# Patient Record
Sex: Female | Born: 1975 | Race: White | Hispanic: No | Marital: Married | Smoking: Current every day smoker
Health system: Southern US, Community
[De-identification: ages and names within clinical notes are randomized; demographics above are authoritative.]

## PROBLEM LIST (undated history)

## (undated) ENCOUNTER — Emergency Department (HOSPITAL_BASED_OUTPATIENT_CLINIC_OR_DEPARTMENT_OTHER): Discharge status: Absent without leave | Payer: Medicare Other

## (undated) DIAGNOSIS — F329 Major depressive disorder, single episode, unspecified: Secondary | ICD-10-CM

## (undated) DIAGNOSIS — F1910 Other psychoactive substance abuse, uncomplicated: Secondary | ICD-10-CM

## (undated) DIAGNOSIS — B019 Varicella without complication: Secondary | ICD-10-CM

## (undated) DIAGNOSIS — M797 Fibromyalgia: Secondary | ICD-10-CM

## (undated) DIAGNOSIS — K810 Acute cholecystitis: Secondary | ICD-10-CM

## (undated) DIAGNOSIS — R87612 Low grade squamous intraepithelial lesion on cytologic smear of cervix (LGSIL): Secondary | ICD-10-CM

## (undated) DIAGNOSIS — N893 Dysplasia of vagina, unspecified: Secondary | ICD-10-CM

## (undated) DIAGNOSIS — J189 Pneumonia, unspecified organism: Secondary | ICD-10-CM

## (undated) DIAGNOSIS — F32A Depression, unspecified: Secondary | ICD-10-CM

## (undated) DIAGNOSIS — Z8674 Personal history of sudden cardiac arrest: Secondary | ICD-10-CM

## (undated) DIAGNOSIS — J45909 Unspecified asthma, uncomplicated: Secondary | ICD-10-CM

## (undated) DIAGNOSIS — K219 Gastro-esophageal reflux disease without esophagitis: Secondary | ICD-10-CM

## (undated) DIAGNOSIS — K8000 Calculus of gallbladder with acute cholecystitis without obstruction: Secondary | ICD-10-CM

## (undated) DIAGNOSIS — F419 Anxiety disorder, unspecified: Secondary | ICD-10-CM

## (undated) HISTORY — DX: Anxiety disorder, unspecified: F41.9

## (undated) HISTORY — DX: Dysplasia of vagina, unspecified: N89.3

## (undated) HISTORY — DX: Varicella without complication: B01.9

## (undated) HISTORY — DX: Calculus of gallbladder with acute cholecystitis without obstruction: K80.00

## (undated) HISTORY — DX: Low grade squamous intraepithelial lesion on cytologic smear of cervix (LGSIL): R87.612

## (undated) HISTORY — DX: Fibromyalgia: M79.7

## (undated) HISTORY — PX: WRIST SURGERY: SHX841

## (undated) HISTORY — PX: NASAL SINUS SURGERY: SHX719

## (undated) HISTORY — DX: Acute cholecystitis: K81.0

---

## 2000-12-25 HISTORY — PX: HERNIA REPAIR: SHX51

## 2003-12-21 ENCOUNTER — Other Ambulatory Visit: Payer: Self-pay

## 2003-12-26 HISTORY — PX: BREAST EXCISIONAL BIOPSY: SUR124

## 2005-03-20 ENCOUNTER — Emergency Department: Payer: Self-pay | Admitting: General Practice

## 2005-06-23 ENCOUNTER — Other Ambulatory Visit: Payer: Self-pay

## 2005-06-23 ENCOUNTER — Inpatient Hospital Stay: Payer: Self-pay | Admitting: Psychiatry

## 2005-12-27 ENCOUNTER — Inpatient Hospital Stay: Payer: Self-pay | Admitting: Psychiatry

## 2006-02-03 ENCOUNTER — Ambulatory Visit: Payer: Self-pay | Admitting: Psychiatry

## 2006-04-07 ENCOUNTER — Emergency Department: Payer: Self-pay | Admitting: Emergency Medicine

## 2006-04-12 ENCOUNTER — Ambulatory Visit: Payer: Self-pay | Admitting: Otolaryngology

## 2006-05-11 ENCOUNTER — Other Ambulatory Visit: Payer: Self-pay

## 2006-05-11 ENCOUNTER — Emergency Department: Payer: Self-pay | Admitting: Unknown Physician Specialty

## 2006-05-30 ENCOUNTER — Emergency Department: Payer: Self-pay | Admitting: Emergency Medicine

## 2006-07-13 ENCOUNTER — Other Ambulatory Visit: Payer: Self-pay

## 2006-07-13 ENCOUNTER — Observation Stay: Payer: Self-pay | Admitting: Internal Medicine

## 2006-07-14 ENCOUNTER — Inpatient Hospital Stay: Payer: Self-pay | Admitting: Psychiatry

## 2007-03-06 ENCOUNTER — Emergency Department: Payer: Self-pay | Admitting: Emergency Medicine

## 2007-03-27 ENCOUNTER — Emergency Department: Payer: Self-pay | Admitting: Emergency Medicine

## 2007-04-27 ENCOUNTER — Emergency Department: Payer: Self-pay | Admitting: General Practice

## 2008-04-14 ENCOUNTER — Emergency Department: Payer: Self-pay | Admitting: Emergency Medicine

## 2009-01-11 ENCOUNTER — Emergency Department: Payer: Self-pay

## 2009-04-26 ENCOUNTER — Emergency Department: Payer: Self-pay | Admitting: Emergency Medicine

## 2009-07-05 ENCOUNTER — Ambulatory Visit: Payer: Self-pay | Admitting: Orthopedic Surgery

## 2009-07-06 ENCOUNTER — Ambulatory Visit: Payer: Self-pay | Admitting: Orthopedic Surgery

## 2009-08-13 ENCOUNTER — Encounter: Payer: Self-pay | Admitting: Orthopedic Surgery

## 2009-08-25 ENCOUNTER — Encounter: Payer: Self-pay | Admitting: Orthopedic Surgery

## 2009-09-03 ENCOUNTER — Ambulatory Visit: Payer: Self-pay

## 2011-06-07 ENCOUNTER — Ambulatory Visit: Payer: Self-pay

## 2011-12-26 HISTORY — PX: ABDOMINAL HYSTERECTOMY: SHX81

## 2012-04-22 ENCOUNTER — Ambulatory Visit: Payer: Self-pay | Admitting: General Practice

## 2012-04-24 ENCOUNTER — Ambulatory Visit: Payer: Self-pay | Admitting: General Practice

## 2012-06-27 ENCOUNTER — Emergency Department: Payer: Self-pay | Admitting: Emergency Medicine

## 2012-06-27 LAB — COMPREHENSIVE METABOLIC PANEL
Anion Gap: 8 (ref 7–16)
BUN: 11 mg/dL (ref 7–18)
Bilirubin,Total: 0.6 mg/dL (ref 0.2–1.0)
Chloride: 111 mmol/L — ABNORMAL HIGH (ref 98–107)
Creatinine: 0.9 mg/dL (ref 0.60–1.30)
EGFR (African American): >60
EGFR (Non-African Amer.): >60
Potassium: 3.5 mmol/L (ref 3.5–5.1)
SGPT (ALT): 30 U/L
Total Protein: 6.9 g/dL (ref 6.4–8.2)

## 2012-06-27 LAB — ETHANOL: Ethanol %: <0.003 % (ref 0.000–0.080)

## 2012-06-27 LAB — URINALYSIS, COMPLETE
Glucose,UR: NEGATIVE mg/dL (ref 0–75)
Nitrite: NEGATIVE
RBC,UR: 23 /HPF (ref 0–5)
WBC UR: 72 /HPF (ref 0–5)

## 2012-06-27 LAB — DRUG SCREEN, URINE
Barbiturates, Ur Screen: NEGATIVE (ref ?–200)
Cannabinoid 50 Ng, Ur ~~LOC~~: NEGATIVE (ref ?–50)
Cocaine Metabolite,Ur ~~LOC~~: POSITIVE (ref ?–300)
MDMA (Ecstasy)Ur Screen: POSITIVE (ref ?–500)
Methadone, Ur Screen: NEGATIVE (ref ?–300)
Phencyclidine (PCP) Ur S: NEGATIVE (ref ?–25)

## 2012-06-27 LAB — CBC
HCT: 41.5 % (ref 35.0–47.0)
HGB: 13.8 g/dL (ref 12.0–16.0)
MCHC: 33.2 g/dL (ref 32.0–36.0)
MCV: 95 fL (ref 80–100)
Platelet: 241 10*3/uL (ref 150–440)
RBC: 4.38 10*6/uL (ref 3.80–5.20)

## 2012-06-29 LAB — URINE CULTURE

## 2013-03-29 ENCOUNTER — Emergency Department: Payer: Self-pay | Admitting: Emergency Medicine

## 2013-03-29 LAB — BASIC METABOLIC PANEL
Anion Gap: 2 — ABNORMAL LOW (ref 7–16)
BUN: 10 mg/dL (ref 7–18)
Co2: 27 mmol/L (ref 21–32)
Creatinine: 0.75 mg/dL (ref 0.60–1.30)
EGFR (African American): >60
EGFR (Non-African Amer.): >60
Glucose: 85 mg/dL (ref 65–99)
Osmolality: 274 (ref 275–301)
Potassium: 4.2 mmol/L (ref 3.5–5.1)
Sodium: 138 mmol/L (ref 136–145)

## 2013-03-29 LAB — CBC
MCH: 30.9 pg (ref 26.0–34.0)
MCHC: 33.9 g/dL (ref 32.0–36.0)
MCV: 91 fL (ref 80–100)
Platelet: 223 10*3/uL (ref 150–440)
RBC: 4.67 10*6/uL (ref 3.80–5.20)
WBC: 4.2 10*3/uL (ref 3.6–11.0)

## 2013-03-29 LAB — CK TOTAL AND CKMB (NOT AT ARMC): CK, Total: 46 U/L (ref 21–215)

## 2013-05-15 ENCOUNTER — Ambulatory Visit: Payer: Self-pay | Admitting: Nurse Practitioner

## 2013-09-11 ENCOUNTER — Ambulatory Visit: Payer: Self-pay | Admitting: Internal Medicine

## 2014-03-04 ENCOUNTER — Emergency Department: Payer: Self-pay | Admitting: Emergency Medicine

## 2014-03-04 LAB — CBC
HCT: 43.2 % (ref 35.0–47.0)
HGB: 15 g/dL (ref 12.0–16.0)
MCH: 31.6 pg (ref 26.0–34.0)
MCHC: 34.6 g/dL (ref 32.0–36.0)
MCV: 91 fL (ref 80–100)
PLATELETS: 236 10*3/uL (ref 150–440)
RBC: 4.74 10*6/uL (ref 3.80–5.20)
RDW: 12.7 % (ref 11.5–14.5)
WBC: 4.3 10*3/uL (ref 3.6–11.0)

## 2014-03-04 LAB — BASIC METABOLIC PANEL
ANION GAP: 3 — AB (ref 7–16)
BUN: 7 mg/dL (ref 7–18)
Calcium, Total: 8.7 mg/dL (ref 8.5–10.1)
Chloride: 109 mmol/L — ABNORMAL HIGH (ref 98–107)
Co2: 27 mmol/L (ref 21–32)
Creatinine: 0.8 mg/dL (ref 0.60–1.30)
EGFR (African American): >60
EGFR (Non-African Amer.): >60
GLUCOSE: 78 mg/dL (ref 65–99)
OSMOLALITY: 274 (ref 275–301)
Potassium: 4.2 mmol/L (ref 3.5–5.1)
SODIUM: 139 mmol/L (ref 136–145)

## 2014-03-04 LAB — TROPONIN I

## 2014-06-12 ENCOUNTER — Ambulatory Visit: Payer: Self-pay | Admitting: Nurse Practitioner

## 2015-04-08 ENCOUNTER — Ambulatory Visit
Admit: 2015-04-08 | Disposition: A | Discharge status: Home / Self Care | Payer: Self-pay | Attending: Nurse Practitioner | Admitting: Nurse Practitioner

## 2015-06-30 ENCOUNTER — Ambulatory Visit: Payer: Self-pay | Admitting: Obstetrics and Gynecology

## 2015-07-08 ENCOUNTER — Ambulatory Visit (INDEPENDENT_AMBULATORY_CARE_PROVIDER_SITE_OTHER): Payer: Medicaid Other | Admitting: Obstetrics and Gynecology

## 2015-07-08 ENCOUNTER — Encounter: Payer: Self-pay | Admitting: Obstetrics and Gynecology

## 2015-07-08 VITALS — BP 124/76 | HR 99 | Ht 63.0 in | Wt 164.8 lb

## 2015-07-08 DIAGNOSIS — J310 Chronic rhinitis: Secondary | ICD-10-CM | POA: Insufficient documentation

## 2015-07-08 DIAGNOSIS — B019 Varicella without complication: Secondary | ICD-10-CM | POA: Insufficient documentation

## 2015-07-08 DIAGNOSIS — E559 Vitamin D deficiency, unspecified: Secondary | ICD-10-CM | POA: Insufficient documentation

## 2015-07-08 DIAGNOSIS — F32A Depression, unspecified: Secondary | ICD-10-CM | POA: Insufficient documentation

## 2015-07-08 DIAGNOSIS — N943 Premenstrual tension syndrome: Secondary | ICD-10-CM | POA: Diagnosis not present

## 2015-07-08 DIAGNOSIS — F819 Developmental disorder of scholastic skills, unspecified: Secondary | ICD-10-CM | POA: Insufficient documentation

## 2015-07-08 DIAGNOSIS — Z720 Tobacco use: Secondary | ICD-10-CM

## 2015-07-08 DIAGNOSIS — J45909 Unspecified asthma, uncomplicated: Secondary | ICD-10-CM | POA: Insufficient documentation

## 2015-07-08 DIAGNOSIS — F329 Major depressive disorder, single episode, unspecified: Secondary | ICD-10-CM | POA: Insufficient documentation

## 2015-07-08 HISTORY — DX: Varicella without complication: B01.9

## 2015-07-08 MED ORDER — FLUOXETINE HCL 20 MG PO CAPS: 20.0000 mg | ORAL_CAPSULE | Freq: Every day | ORAL | Status: DC

## 2015-07-08 MED ORDER — LEVONORGEST-ETH ESTRAD 91-DAY 0.15-0.03 MG PO TABS: 1.0000 | ORAL_TABLET | Freq: Every day | ORAL | Status: DC

## 2015-07-09 ENCOUNTER — Encounter: Payer: Self-pay | Admitting: Obstetrics and Gynecology

## 2015-07-09 DIAGNOSIS — N943 Premenstrual tension syndrome: Secondary | ICD-10-CM | POA: Insufficient documentation

## 2015-07-09 DIAGNOSIS — Z720 Tobacco use: Secondary | ICD-10-CM | POA: Insufficient documentation

## 2015-07-09 MED ORDER — NORETHINDRONE 0.35 MG PO TABS: 1.0000 | ORAL_TABLET | Freq: Every day | ORAL | Status: DC

## 2015-07-09 NOTE — Progress Notes (Signed)
GYNECOLOGY PROGRESS NOTE  Subjective:    Patient ID: Shannon Campbell, female    DOB: December 23, 1976, 39 y.o.   MRN: [id number]  HPI  Patient is a 39 y.o. [id number] female who presents as a referral from [facility] for discussion of "hormonal imbalance".  Patient notes that she has had symptoms of severe PMS since onset of menses. Menarche age 89.  Diagnosed with precocious puberty at age 89.   PMS symtoms include severe mood changes, acne, weight gain with bloating, dysmenorrhea with all occuring just prior to menses. Also with heavy menstrual cycles resulting in seeking definitive therapy with hysterectomy several years ago.  Has been tried on cyclic OCPs in the past (in teenage years) and Mirena IUD prior to hysterectomy.  Also has participated in psychotherapy over the past 17 years, and has been tried on antidepressants before (was on Wellbutrin x 6 years, however notes only modest improvement in symptoms, and now reports no improvement at all over the past year so is weaning off; has also tried Prozac in the past  years, using 1st 2 weeks prior to menses and noted improvement, however was not consistent in taking the medication at the time.   The following portions of the patient's history were reviewed and updated as appropriate: allergies, current medications, past family history, past medical history, past social history, past surgical history and problem list.  Review of Systems Pertinent items are noted in HPI.   Objective:   Blood pressure 124/76, pulse 99, height 5\' 3"  (1.6 m), weight 164 lb 12.8 oz (74.753 kg), last menstrual period 12/26/2011. General appearance: alert and no distress Exam deferred.   Assessment:   Moderate-severe PMS symptoms  Plan:   Discussion had with patient regarding symptom management.  Discussed use of other hormonal agents, including continuous OCPs (progesterone only as patient is a smoker), Nexplanon, Depo Provera, as well ans another trial of  Prozac to take 2 weeks prior to onset of symptoms.  Patient to begin Prozac once weaned off Wellbutrin if symptoms persist despite use of continuous OCPs.  Encouraged smoking cessation. RTC in 2 months to reassess symptoms.   Hildred LaserAnika Rosibel Giacobbe, MD [facility]

## 2015-07-09 NOTE — Patient Instructions (Signed)
Take Prozac 2 weeks prior to onset of symptoms.  Keep calendar to track monthly onset of symptoms.  Take OCPs daily.  Smoking cessation.

## 2015-09-09 ENCOUNTER — Ambulatory Visit: Payer: Medicaid Other | Admitting: Obstetrics and Gynecology

## 2015-11-01 DIAGNOSIS — M2550 Pain in unspecified joint: Secondary | ICD-10-CM

## 2015-11-01 DIAGNOSIS — R768 Other specified abnormal immunological findings in serum: Secondary | ICD-10-CM | POA: Insufficient documentation

## 2015-11-01 DIAGNOSIS — G8929 Other chronic pain: Secondary | ICD-10-CM | POA: Insufficient documentation

## 2015-11-01 DIAGNOSIS — R5382 Chronic fatigue, unspecified: Secondary | ICD-10-CM | POA: Insufficient documentation

## 2016-05-16 ENCOUNTER — Encounter: Payer: Self-pay | Admitting: Obstetrics and Gynecology

## 2016-05-23 ENCOUNTER — Encounter: Payer: Self-pay | Admitting: Obstetrics and Gynecology

## 2016-05-29 ENCOUNTER — Other Ambulatory Visit: Payer: Self-pay | Admitting: Family

## 2016-08-06 ENCOUNTER — Observation Stay
Admission: EM | Admit: 2016-08-06 | Discharge: 2016-08-08 | Disposition: A | Discharge status: Home / Self Care | Payer: Medicare Other | Attending: Internal Medicine | Admitting: Internal Medicine

## 2016-08-06 ENCOUNTER — Encounter: Payer: Self-pay | Admitting: Emergency Medicine

## 2016-08-06 ENCOUNTER — Emergency Department: Payer: Medicare Other

## 2016-08-06 DIAGNOSIS — Z90710 Acquired absence of both cervix and uterus: Secondary | ICD-10-CM | POA: Insufficient documentation

## 2016-08-06 DIAGNOSIS — F419 Anxiety disorder, unspecified: Secondary | ICD-10-CM | POA: Diagnosis not present

## 2016-08-06 DIAGNOSIS — Z8674 Personal history of sudden cardiac arrest: Secondary | ICD-10-CM | POA: Diagnosis not present

## 2016-08-06 DIAGNOSIS — Z79891 Long term (current) use of opiate analgesic: Secondary | ICD-10-CM | POA: Insufficient documentation

## 2016-08-06 DIAGNOSIS — M359 Systemic involvement of connective tissue, unspecified: Secondary | ICD-10-CM | POA: Diagnosis not present

## 2016-08-06 DIAGNOSIS — Z79899 Other long term (current) drug therapy: Secondary | ICD-10-CM | POA: Insufficient documentation

## 2016-08-06 DIAGNOSIS — J45909 Unspecified asthma, uncomplicated: Secondary | ICD-10-CM | POA: Diagnosis not present

## 2016-08-06 DIAGNOSIS — F17210 Nicotine dependence, cigarettes, uncomplicated: Secondary | ICD-10-CM | POA: Insufficient documentation

## 2016-08-06 DIAGNOSIS — F329 Major depressive disorder, single episode, unspecified: Secondary | ICD-10-CM | POA: Insufficient documentation

## 2016-08-06 DIAGNOSIS — Z793 Long term (current) use of hormonal contraceptives: Secondary | ICD-10-CM | POA: Diagnosis not present

## 2016-08-06 DIAGNOSIS — R079 Chest pain, unspecified: Secondary | ICD-10-CM | POA: Diagnosis not present

## 2016-08-06 DIAGNOSIS — Z9889 Other specified postprocedural states: Secondary | ICD-10-CM | POA: Insufficient documentation

## 2016-08-06 DIAGNOSIS — J189 Pneumonia, unspecified organism: Secondary | ICD-10-CM | POA: Insufficient documentation

## 2016-08-06 HISTORY — DX: Major depressive disorder, single episode, unspecified: F32.9

## 2016-08-06 HISTORY — DX: Unspecified asthma, uncomplicated: J45.909

## 2016-08-06 HISTORY — DX: Depression, unspecified: F32.A

## 2016-08-06 HISTORY — DX: Personal history of sudden cardiac arrest: Z86.74

## 2016-08-06 LAB — CBC
HCT: 40.1 % (ref 35.0–47.0)
HEMOGLOBIN: 14.2 g/dL (ref 12.0–16.0)
MCH: 31.9 pg (ref 26.0–34.0)
MCHC: 35.4 g/dL (ref 32.0–36.0)
MCV: 90.2 fL (ref 80.0–100.0)
PLATELETS: 262 10*3/uL (ref 150–440)
RBC: 4.45 MIL/uL (ref 3.80–5.20)
RDW: 12.7 % (ref 11.5–14.5)
WBC: 8.8 10*3/uL (ref 3.6–11.0)

## 2016-08-06 LAB — BASIC METABOLIC PANEL
ANION GAP: 5 (ref 5–15)
BUN: 10 mg/dL (ref 6–20)
CALCIUM: 8.7 mg/dL — AB (ref 8.9–10.3)
CO2: 27 mmol/L (ref 22–32)
CREATININE: 0.87 mg/dL (ref 0.44–1.00)
Chloride: 106 mmol/L (ref 101–111)
Glucose, Bld: 71 mg/dL (ref 65–99)
Potassium: 3.6 mmol/L (ref 3.5–5.1)
SODIUM: 138 mmol/L (ref 135–145)

## 2016-08-06 LAB — TROPONIN I

## 2016-08-06 MED ORDER — ASPIRIN 81 MG PO CHEW
324.0000 mg | CHEWABLE_TABLET | Freq: Once | ORAL | Status: AC
  Administered 2016-08-06: 324 mg via ORAL
  Filled 2016-08-06: qty 4

## 2016-08-06 MED ORDER — NITROGLYCERIN 0.4 MG SL SUBL
0.4000 mg | SUBLINGUAL_TABLET | SUBLINGUAL | Status: DC | PRN
  Administered 2016-08-06 – 2016-08-07 (×2): 0.4 mg via SUBLINGUAL
  Filled 2016-08-06 (×2): qty 1

## 2016-08-06 NOTE — ED Provider Notes (Signed)
[facility] Emergency Department Provider Note  ____________________________________________   I have reviewed the triage vital signs and the nursing notes.   HISTORY  Chief Complaint Chest Pain   HPI Shannon Campbell is a 40 y.o. 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 Patient states the pain was initially 10 out of 10 located to the left chest radiating to her mid upper back. Patient also admits to dyspnea the time of pain. Patient denies any diaphoresis. Patient current pain score is 8 out of 10. Patient denies any other medical history. Patient denies any lower extremity pain or swelling   Past Medical History:  Diagnosis Date  . Anxiety   . Asthma   . Depression   . History of cardiac arrest   . Pap smear abnormality of cervix with LGSIL   . VAIN (vaginal intraepithelial neoplasia)     Patient Active Problem List   Diagnosis Date Noted  . PMS (premenstrual syndrome) 07/09/2015  . Tobacco abuse 07/09/2015  . Asthma without status asthmaticus 07/08/2015  . Chicken pox 07/08/2015  . Clinical depression 07/08/2015  . 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 Inflamed nasal mucosa 07/08/2015  . Avitaminosis D 07/08/2015    Past Surgical History:  Procedure Laterality Date  . ABDOMINAL HYSTERECTOMY  2013  . CESAREAN SECTION  1999  . HERNIA REPAIR  2002  . NASAL SINUS SURGERY    . WRIST SURGERY      Prior to Admission medications   Medication Sig Start Date End Date Taking? Authorizing Provider  buPROPion (WELLBUTRIN XL) 150 MG 24 hr tablet Take 150 mg by mouth daily.   Yes Historical Provider, MD  clonazePAM (KLONOPIN) 0.5 MG tablet Take 0.5 mg by mouth at bedtime.   Yes Historical Provider, MD  FLUoxetine (PROZAC) 20 MG capsule Take 1 capsule (20 mg total) by mouth daily. Begin with 1/2 tablet daily for 2 weeks, if  symptoms unrelieved, can increase to 1 whole tablet.  To take for 2 weeks prior to menses each month. Patient taking differently: Take 20 mg by mouth daily.  07/08/15  Yes Hildred Laser, MD  HYDROcodone-acetaminophen (NORCO/VICODIN) 5-325 MG tablet Take 1 tablet by mouth every 6 (six) hours as needed for moderate pain.    Yes Historical Provider, MD  methylphenidate (METADATE CD) 20 MG CR capsule Take 20 mg by mouth 2 (two) times daily.   Yes Historical Provider, MD  omega-3 acid ethyl esters (LOVAZA) 1 g capsule Take by mouth 2 (two) times daily.   Yes Historical Provider, MD  Omega-3 Fatty Acids (FISH OIL) 1000 MG CAPS Take 1,000 mg by mouth daily.   Yes Historical Provider, MD  norethindrone (MICRONOR,CAMILA,ERRIN) 0.35 MG tablet Take 1 tablet (0.35 mg total) by mouth daily. Patient not taking: Reported on 08/07/2016 07/09/15   Hildred Laser, MD    Allergies Prednisone  Family History  Problem Relation Age of Onset  . Thyroid disease Mother   . Hypertension Father   . Hemochromatosis Father     Social History Social History  Substance Use Topics  . Smoking status: Current Every Day Smoker    Packs/day: 1.00    Years: 15.00    Types: Cigarettes  . Smokeless tobacco: Never Used  . Alcohol use No    Review of Systems Constitutional: No fever/chills Eyes: No visual changes. ENT: No sore throat. Cardiovascular: Positive for chest pain.  Respiratory: Positive for shortness of breath. Gastrointestinal: No abdominal pain.  No nausea, no vomiting.  No diarrhea.  No constipation. Genitourinary: Negative for dysuria. Musculoskeletal: Negative for back pain. Skin: Negative for rash. Neurological: Negative for headaches, focal weakness or numbness.  10-point ROS otherwise negative.  ____________________________________________   PHYSICAL EXAM:  VITAL SIGNS: ED Triage Vitals [08/06/16 2301]  Enc Vitals Group     BP 116/73     Pulse Rate 70     Resp 20     Temp 97.8 F (36.6 C)      Temp Source Oral     SpO2 99 %     Weight 168 lb (76.2 kg)     Height 5\' 3"  (1.6 m)     Head Circumference      Peak Flow      Pain Score 9     Pain Loc      Pain Edu?      Excl. in GC?     Constitutional: Alert and oriented. Well appearing and in no acute distress. Eyes: Conjunctivae are normal. PERRL. EOMI. Head: Atraumatic. Mouth/Throat: Mucous membranes are moist.  Oropharynx non-erythematous. Neck: No stridor.  No meningeal signs.   Cardiovascular: Normal rate, regular rhythm. Good peripheral circulation. Grossly normal heart sounds.   Respiratory: Normal respiratory effort.  No retractions. Lungs CTAB. Gastrointestinal: Soft and nontender. No distention.  Musculoskeletal: No lower extremity tenderness nor edema. No gross deformities of extremities. Neurologic:  Normal speech and language. No gross focal neurologic deficits are appreciated.  Skin:  Skin is warm, dry and intact. No rash noted. Psychiatric: Mood and affect are normal. Speech and behavior are normal.  ____________________________________________   LABS (all labs ordered are listed, but only abnormal results are displayed)  Labs Reviewed  BASIC METABOLIC PANEL - Abnormal; Notable for the following:       Result Value   Calcium 8.7 (*)    All other components within normal limits  FIBRIN DERIVATIVES D-DIMER ([facility] ONLY) - Abnormal; Notable for the following:    Fibrin derivatives D-dimer ([facility]) 500 (*)    All other components within normal limits  CBC  TROPONIN I  TROPONIN I  URINALYSIS COMPLETEWITH MICROSCOPIC ([facility] ONLY)  URINE DRUG SCREEN, QUALITATIVE ([facility] ONLY)   ____________________________________________  EKG  ED ECG REPORT I, [place] N BROWN, the attending physician, personally viewed and interpreted this ECG.   Date: 08/06/2016  EKG Time: 10:59 PM  Rate: 83  Rhythm: Sinus rhythm  Axis: Normal  Intervals: Normal  ST&T Change:  None  ____________________________________________  RADIOLOGY I, [place] N BROWN, personally viewed and evaluated these images (plain radiographs) as part of my medical decision making, as well as reviewing the written report by the radiologist.  Dg Chest 2 View  Result Date: 08/06/2016 CLINICAL DATA:  Sudden onset sharp chest pain radiating to the back. Left arm pain. Onset 1 hour ago. Shortness of breath and nausea. EXAM: CHEST  2 VIEW COMPARISON:  03/04/2014 FINDINGS: The heart size and mediastinal contours are within normal limits. Both lungs are clear. The visualized skeletal structures are unremarkable. IMPRESSION: No active cardiopulmonary disease. Electronically Signed   By: Burman NievesWilliam  Stevens M.D.   On: 08/06/2016 23:50   Ct Angio Chest Pe W And/or Wo Contrast  Result Date: 08/07/2016 CLINICAL DATA:  Acute onset of sharp mid chest pain, radiating to the back, with left arm pain. Shortness of breath and nausea. Initial encounter. EXAM: CT ANGIOGRAPHY CHEST WITH CONTRAST TECHNIQUE: Multidetector CT imaging  of the chest was performed using the standard protocol during bolus administration of intravenous contrast. Multiplanar CT image reconstructions and MIPs were obtained to evaluate the vascular anatomy. CONTRAST:  75 mL of Isovue 370 IV contrast COMPARISON:  Chest radiograph performed 08/06/2016 FINDINGS: There is no evidence of pulmonary embolus. Minimal bibasilar atelectasis is noted. Minimal scattered peripheral opacities are seen bilaterally, raising concern for an acute infectious process. There is no evidence of pleural effusion or pneumothorax. No masses are identified; no abnormal focal contrast enhancement is seen. The mediastinum is unremarkable in appearance. No mediastinal lymphadenopathy is seen. No pericardial effusion is identified. The great vessels are grossly unremarkable in appearance. No axillary lymphadenopathy is seen. The visualized portions of the thyroid gland are  unremarkable in appearance. The visualized portions of the liver and spleen are unremarkable. No acute osseous abnormalities are seen. Review of the MIP images confirms the above findings. IMPRESSION: 1. No evidence of pulmonary embolus. 2. Minimal scattered peripheral opacities seen bilaterally, concerning for an acute infectious process. 3. Minimal bibasilar atelectasis noted. Electronically Signed   By: Roanna Raider M.D.   On: 08/07/2016 02:40      Procedures     INITIAL IMPRESSION / ASSESSMENT AND PLAN / ED COURSE  Pertinent labs & imaging results that were available during my care of the patient were reviewed by me and considered in my medical decision making (see chart for details).  EKG revealed no evidence of ischemia or infarct. CT scan of the chest revealed as stated above (sees no evidence of pulmonary emboli) Patient given aspirin 324 mg sublingual nitroglycerin without any improvement of pain. Some subsequent patient was given morphine 4 mg with no improvement of pain. At this time patient admits to ongoing chest pain and a such patient was discussed with Dr. Sheryle Hail for hospital admission for further evaluation and management.  Clinical Course    ____________________________________________  FINAL CLINICAL IMPRESSION(S) / ED DIAGNOSES  Final diagnoses:  Chest pain, unspecified chest pain type     MEDICATIONS GIVEN DURING THIS VISIT:  Medications  nitroGLYCERIN (NITROSTAT) SL tablet 0.4 mg (0.4 mg Sublingual Given 08/06/16 2339)  HYDROmorphone (DILAUDID) injection 1 mg (not administered)  aspirin chewable tablet 324 mg (324 mg Oral Given 08/06/16 2339)  morphine 4 MG/ML injection 4 mg (4 mg Intravenous Given 08/07/16 0040)  iopamidol (ISOVUE-370) 76 % injection 75 mL (75 mLs Intravenous Contrast Given 08/07/16 0211)     NEW OUTPATIENT MEDICATIONS STARTED DURING THIS VISIT:  New Prescriptions   No medications on file      Note:  This document was prepared  using Dragon voice recognition software and may include unintentional dictation errors.    Darci Current, MD 08/07/16 [phone]

## 2016-08-06 NOTE — ED Triage Notes (Signed)
Pt presents to ED with sudden onset of sharp mid chest pain that radiates to her back with left arm pain. Sudden onset approx 1 hour ago while sitting on couch. Pt denies similar symptoms. +sob and nausea. Skin has remained warm and dry.

## 2016-08-06 NOTE — ED Notes (Signed)
Patient transported to X-ray 

## 2016-08-07 ENCOUNTER — Observation Stay: Payer: Medicare Other

## 2016-08-07 ENCOUNTER — Emergency Department: Payer: Medicare Other

## 2016-08-07 DIAGNOSIS — R079 Chest pain, unspecified: Secondary | ICD-10-CM | POA: Diagnosis not present

## 2016-08-07 LAB — TROPONIN I
Troponin I: <0.03 ng/mL (ref <0.03)
Troponin I: <0.03 ng/mL (ref <0.03)
Troponin I: <0.03 ng/mL (ref <0.03)

## 2016-08-07 LAB — NM MYOCAR MULTI W/SPECT W/WALL MOTION / EF
CHL CUP NUCLEAR SDS: 0
CHL CUP NUCLEAR SSS: 0
CHL CUP RESTING HR STRESS: 69 {beats}/min
CSEPED: 9 min
CSEPEW: 9.4 METS
CSEPPHR: 139 {beats}/min
Exercise duration (sec): 30 s
LV dias vol: 104 mL (ref 46–106)
LVSYSVOL: 40 mL
NUC STRESS TID: 0.84
SRS: 0

## 2016-08-07 LAB — URINALYSIS COMPLETE WITH MICROSCOPIC (ARMC ONLY)
Bacteria, UA: NONE SEEN
Bilirubin Urine: NEGATIVE
Glucose, UA: NEGATIVE mg/dL
Hgb urine dipstick: NEGATIVE
Ketones, ur: NEGATIVE mg/dL
Leukocytes, UA: NEGATIVE
Nitrite: NEGATIVE
Protein, ur: NEGATIVE mg/dL
Specific Gravity, Urine: >1.06 — ABNORMAL HIGH (ref 1.005–1.030)
pH: 6 (ref 5.0–8.0)

## 2016-08-07 LAB — URINE DRUG SCREEN, QUALITATIVE (ARMC ONLY)
Amphetamines, Ur Screen: NOT DETECTED
Barbiturates, Ur Screen: NOT DETECTED
Benzodiazepine, Ur Scrn: NOT DETECTED
Cannabinoid 50 Ng, Ur ~~LOC~~: NOT DETECTED
Cocaine Metabolite,Ur ~~LOC~~: NOT DETECTED
MDMA (Ecstasy)Ur Screen: NOT DETECTED
Methadone Scn, Ur: NOT DETECTED
Opiate, Ur Screen: POSITIVE — AB
Phencyclidine (PCP) Ur S: NOT DETECTED
Tricyclic, Ur Screen: NOT DETECTED

## 2016-08-07 LAB — FIBRIN DERIVATIVES D-DIMER (ARMC ONLY): FIBRIN DERIVATIVES D-DIMER (ARMC): 500 — AB (ref 0–499)

## 2016-08-07 LAB — TSH: TSH: 3.537 u[IU]/mL (ref 0.350–4.500)

## 2016-08-07 LAB — HEMOGLOBIN A1C: Hgb A1c MFr Bld: 4.6 % (ref 4.0–6.0)

## 2016-08-07 MED ORDER — ENOXAPARIN SODIUM 40 MG/0.4ML ~~LOC~~ SOLN
40.0000 mg | SUBCUTANEOUS | Status: DC
  Administered 2016-08-07: 40 mg via SUBCUTANEOUS
  Filled 2016-08-07: qty 0.4

## 2016-08-07 MED ORDER — ACETAMINOPHEN 650 MG RE SUPP: 650.0000 mg | Freq: Four times a day (QID) | RECTAL | Status: DC | PRN

## 2016-08-07 MED ORDER — ONDANSETRON HCL 4 MG PO TABS: 4.0000 mg | ORAL_TABLET | Freq: Four times a day (QID) | ORAL | Status: DC | PRN

## 2016-08-07 MED ORDER — MORPHINE SULFATE (PF) 2 MG/ML IV SOLN
INTRAVENOUS | Status: AC
  Administered 2016-08-07: 2 mg via INTRAVENOUS
  Filled 2016-08-07: qty 1

## 2016-08-07 MED ORDER — MORPHINE SULFATE (PF) 4 MG/ML IV SOLN
4.0000 mg | Freq: Once | INTRAVENOUS | Status: AC
  Administered 2016-08-07: 4 mg via INTRAVENOUS
  Filled 2016-08-07: qty 1

## 2016-08-07 MED ORDER — FLUOXETINE HCL 20 MG PO CAPS
20.0000 mg | ORAL_CAPSULE | Freq: Every day | ORAL | Status: DC
  Administered 2016-08-07 – 2016-08-08 (×2): 20 mg via ORAL
  Filled 2016-08-07 (×2): qty 1

## 2016-08-07 MED ORDER — CLONAZEPAM 0.5 MG PO TABS
0.5000 mg | ORAL_TABLET | Freq: Every day | ORAL | Status: DC
  Administered 2016-08-07: 0.5 mg via ORAL
  Filled 2016-08-07: qty 1

## 2016-08-07 MED ORDER — FLUCONAZOLE 100 MG PO TABS
100.0000 mg | ORAL_TABLET | Freq: Once | ORAL | Status: AC
Start: 2016-08-07 — End: 2016-08-07
  Administered 2016-08-07: 100 mg via ORAL
  Filled 2016-08-07: qty 1

## 2016-08-07 MED ORDER — NITROGLYCERIN 0.4 MG SL SUBL: 0.4000 mg | SUBLINGUAL_TABLET | SUBLINGUAL | Status: DC | PRN

## 2016-08-07 MED ORDER — TECHNETIUM TC 99M TETROFOSMIN IV KIT
13.1500 | PACK | Freq: Once | INTRAVENOUS | Status: AC | PRN
  Administered 2016-08-07: 13.15 via INTRAVENOUS

## 2016-08-07 MED ORDER — IOPAMIDOL (ISOVUE-370) INJECTION 76%
75.0000 mL | Freq: Once | INTRAVENOUS | Status: AC | PRN
  Administered 2016-08-07: 75 mL via INTRAVENOUS

## 2016-08-07 MED ORDER — DOCUSATE SODIUM 100 MG PO CAPS
100.0000 mg | ORAL_CAPSULE | Freq: Two times a day (BID) | ORAL | Status: DC
  Administered 2016-08-07 (×2): 100 mg via ORAL
  Filled 2016-08-07 (×3): qty 1

## 2016-08-07 MED ORDER — GUAIFENESIN ER 600 MG PO TB12
600.0000 mg | ORAL_TABLET | Freq: Two times a day (BID) | ORAL | Status: DC
  Administered 2016-08-07 – 2016-08-08 (×3): 600 mg via ORAL
  Filled 2016-08-07 (×3): qty 1

## 2016-08-07 MED ORDER — BUPROPION HCL ER (XL) 150 MG PO TB24
150.0000 mg | ORAL_TABLET | Freq: Every day | ORAL | Status: DC
  Administered 2016-08-07 – 2016-08-08 (×2): 150 mg via ORAL
  Filled 2016-08-07 (×2): qty 1

## 2016-08-07 MED ORDER — SODIUM CHLORIDE 0.9% FLUSH
3.0000 mL | Freq: Two times a day (BID) | INTRAVENOUS | Status: DC
  Administered 2016-08-07 (×2): 3 mL via INTRAVENOUS

## 2016-08-07 MED ORDER — HYDROMORPHONE HCL 1 MG/ML IJ SOLN
1.0000 mg | Freq: Once | INTRAMUSCULAR | Status: AC
  Administered 2016-08-07: 1 mg via INTRAVENOUS
  Filled 2016-08-07: qty 1

## 2016-08-07 MED ORDER — MORPHINE SULFATE (PF) 2 MG/ML IV SOLN
1.0000 mg | INTRAVENOUS | Status: DC | PRN
  Administered 2016-08-07 (×2): 1 mg via INTRAVENOUS
  Filled 2016-08-07 (×2): qty 1

## 2016-08-07 MED ORDER — NYSTATIN 100000 UNIT/GM EX POWD
Freq: Two times a day (BID) | CUTANEOUS | Status: DC
  Administered 2016-08-07 – 2016-08-08 (×3): via TOPICAL
  Filled 2016-08-07 (×2): qty 15

## 2016-08-07 MED ORDER — OMEGA-3-ACID ETHYL ESTERS 1 G PO CAPS
1.0000 g | ORAL_CAPSULE | Freq: Every day | ORAL | Status: DC
  Administered 2016-08-07 – 2016-08-08 (×2): 1 g via ORAL
  Filled 2016-08-07 (×2): qty 1

## 2016-08-07 MED ORDER — HYDROCODONE-ACETAMINOPHEN 5-325 MG PO TABS
1.0000 | ORAL_TABLET | Freq: Four times a day (QID) | ORAL | Status: DC | PRN
  Administered 2016-08-07: 1 via ORAL
  Filled 2016-08-07: qty 1

## 2016-08-07 MED ORDER — LEVOFLOXACIN IN D5W 500 MG/100ML IV SOLN
500.0000 mg | INTRAVENOUS | Status: DC
  Administered 2016-08-07: 500 mg via INTRAVENOUS
  Filled 2016-08-07 (×2): qty 100

## 2016-08-07 MED ORDER — ACETAMINOPHEN 325 MG PO TABS: 650.0000 mg | ORAL_TABLET | Freq: Four times a day (QID) | ORAL | Status: DC | PRN

## 2016-08-07 MED ORDER — TECHNETIUM TC 99M TETROFOSMIN IV KIT
30.0000 | PACK | Freq: Once | INTRAVENOUS | Status: AC | PRN
  Administered 2016-08-07: 29.84 via INTRAVENOUS

## 2016-08-07 MED ORDER — ONDANSETRON HCL 4 MG/2ML IJ SOLN: 4.0000 mg | Freq: Four times a day (QID) | INTRAMUSCULAR | Status: DC | PRN

## 2016-08-07 MED ORDER — ASPIRIN EC 81 MG PO TBEC
81.0000 mg | DELAYED_RELEASE_TABLET | Freq: Every day | ORAL | Status: DC
  Administered 2016-08-07 – 2016-08-08 (×2): 81 mg via ORAL
  Filled 2016-08-07 (×2): qty 1

## 2016-08-07 MED ORDER — NICOTINE 21 MG/24HR TD PT24
21.0000 mg | MEDICATED_PATCH | Freq: Every day | TRANSDERMAL | Status: DC
  Administered 2016-08-07: 21 mg via TRANSDERMAL
  Filled 2016-08-07 (×2): qty 1

## 2016-08-07 NOTE — H&P (Signed)
Shannon Campbell is an 40 y.o. female.   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 It began at rest and was substernal radiating to her back and her left shoulder. The patient received aspirin and nitroglycerin that relieved her pain to approximately 4 out of 10 in severity. She states that it is continuous and aching in character. She admits to feeling nauseous but denies vomiting or diaphoresis. She also admits to some dyspnea on exertion over the last few weeks. Also, the patient admits to generalized fatigue for much longer. 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 In the emergency department initial laboratory evaluation was reassuring and EKG showed no evidence of ischemia, but due to ongoing chest pain emergency department staff called the hospitalist service for admission.  Past Medical History:  Diagnosis Date  . Anxiety   . Asthma   . Depression   . History of cardiac arrest   . Pap smear abnormality of cervix with LGSIL   . VAIN (vaginal intraepithelial neoplasia)     Past Surgical History:  Procedure Laterality Date  . ABDOMINAL HYSTERECTOMY  2013  . CESAREAN SECTION  1999  . HERNIA REPAIR  2002  . NASAL SINUS SURGERY    . WRIST SURGERY      Family History  Problem Relation Age of Onset  . Thyroid disease Mother   . Hypertension Father   . Hemochromatosis Father    Social History:  reports that she has been smoking Cigarettes.  She has a 15.00 pack-year smoking history. She has never used smokeless tobacco. She reports that she does not drink alcohol or use drugs.  Allergies:  Allergies  Allergen Reactions  . Prednisone     Other reaction(s): Other (See Comments) Makes her moody    Medications Prior to Admission  Medication Sig Dispense Refill  . buPROPion (WELLBUTRIN XL)  150 MG 24 hr tablet Take 150 mg by mouth daily.    . clonazePAM (KLONOPIN) 0.5 MG tablet Take 0.5 mg by mouth at bedtime.    Marland Kitchen FLUoxetine (PROZAC) 20 MG capsule Take 1 capsule (20 mg total) by mouth daily. Begin with 1/2 tablet daily for 2 weeks, if symptoms unrelieved, can increase to 1 whole tablet.  To take for 2 weeks prior to menses each month. (Patient taking differently: Take 20 mg by mouth daily. ) 60 capsule 3  . HYDROcodone-acetaminophen (NORCO/VICODIN) 5-325 MG tablet Take 1 tablet by mouth every 6 (six) hours as needed for moderate pain.     . methylphenidate (METADATE CD) 20 MG CR capsule Take 20 mg by mouth 2 (two) times daily.    Marland Kitchen omega-3 acid ethyl esters (LOVAZA) 1 g capsule Take by mouth 2 (two) times daily.    . Omega-3 Fatty Acids (FISH OIL) 1000 MG CAPS Take 1,000 mg by mouth daily.    . norethindrone (MICRONOR,CAMILA,ERRIN) 0.35 MG tablet Take 1 tablet (0.35 mg total) by mouth daily. (Patient not taking: Reported on 08/07/2016) 1 Package 11    Results for orders placed or performed during the hospital encounter of 08/06/16 (from the past 48 hour(s))  Basic metabolic panel     Status: Abnormal   Collection Time: 08/06/16 11:05 PM  Result Value Ref Range   Sodium 138 135 - 145 mmol/L   Potassium 3.6 3.5 - 5.1 mmol/L   Chloride  106 101 - 111 mmol/L   CO2 27 22 - 32 mmol/L   Glucose, Bld 71 65 - 99 mg/dL   BUN 10 6 - 20 mg/dL   Creatinine, Ser 0.87 0.44 - 1.00 mg/dL   Calcium 8.7 (L) 8.9 - 10.3 mg/dL   GFR calc non Af Amer >60 >60 mL/min   GFR calc Af Amer >60 >60 mL/min    Comment: (NOTE) The eGFR has been calculated using the CKD EPI equation. This calculation has not been validated in all clinical situations. eGFR's persistently <60 mL/min signify possible Chronic Kidney Disease.    Anion gap 5 5 - 15  CBC     Status: None   Collection Time: 08/06/16 11:05 PM  Result Value Ref Range   WBC 8.8 3.6 - 11.0 K/uL   RBC 4.45 3.80 - 5.20 MIL/uL   Hemoglobin 14.2 12.0  - 16.0 g/dL   HCT 40.1 35.0 - 47.0 %   MCV 90.2 80.0 - 100.0 fL   MCH 31.9 26.0 - 34.0 pg   MCHC 35.4 32.0 - 36.0 g/dL   RDW 12.7 11.5 - 14.5 %   Platelets 262 150 - 440 K/uL  Troponin I     Status: None   Collection Time: 08/06/16 11:05 PM  Result Value Ref Range   Troponin I <0.03 <0.03 ng/mL  Fibrin derivatives D-Dimer     Status: Abnormal   Collection Time: 08/06/16 11:05 PM  Result Value Ref Range   Fibrin derivatives D-dimer ([facility]) 500 (H) 0 - 499    Comment: <> Exclusion of Venous Thromboembolism (VTE) - OUTPATIENTS ONLY        (Emergency Department or [place])             0-499 ng/ml (FEU)  : With a low to intermediate pretest                                        probability for VTE this test result                                        excludes the diagnosis of VTE.           > 499 ng/ml (FEU)  : VTE not excluded.  Additional work up                                   for VTE is required.   <>  Testing on Inpatients and Evaluation of Disseminated Intravascular        Coagulation (DIC)             Reference Range:   0-499 ng/ml (FEU)   Troponin I     Status: None   Collection Time: 08/07/16  1:44 AM  Result Value Ref Range   Troponin I <0.03 <0.03 ng/mL  Urinalysis complete, with microscopic ([facility] only)     Status: Abnormal   Collection Time: 08/07/16  6:47 AM  Result Value Ref Range   Color, Urine YELLOW (A) YELLOW   APPearance CLEAR (A) CLEAR   Glucose, UA NEGATIVE NEGATIVE mg/dL   Bilirubin Urine NEGATIVE NEGATIVE   Ketones, ur NEGATIVE NEGATIVE mg/dL   Specific Gravity, Urine >1.060 (H) 1.005 - 1.030  Hgb urine dipstick NEGATIVE NEGATIVE   pH 6.0 5.0 - 8.0   Protein, ur NEGATIVE NEGATIVE mg/dL   Nitrite NEGATIVE NEGATIVE   Leukocytes, UA NEGATIVE NEGATIVE   RBC / HPF 0-5 0 - 5 RBC/hpf   WBC, UA 0-5 0 - 5 WBC/hpf   Bacteria, UA NONE SEEN NONE SEEN   Squamous Epithelial / LPF 6-30 (A) NONE SEEN  Urine Drug Screen, Qualitative ([facility] only)     Status:  Abnormal   Collection Time: 08/07/16  6:47 AM  Result Value Ref Range   Tricyclic, Ur Screen NONE DETECTED NONE DETECTED   Amphetamines, Ur Screen NONE DETECTED NONE DETECTED   MDMA (Ecstasy)Ur Screen NONE DETECTED NONE DETECTED   Cocaine Metabolite,Ur [place] NONE DETECTED NONE DETECTED   Opiate, Ur Screen POSITIVE (A) NONE DETECTED   Phencyclidine (PCP) Ur S NONE DETECTED NONE DETECTED   Cannabinoid 50 Ng, Ur [place] NONE DETECTED NONE DETECTED   Barbiturates, Ur Screen NONE DETECTED NONE DETECTED   Benzodiazepine, Ur Scrn NONE DETECTED NONE DETECTED   Methadone Scn, Ur NONE DETECTED NONE DETECTED    Comment: (NOTE) 326  Tricyclics, urine               Cutoff 1000 ng/mL 200  Amphetamines, urine             Cutoff 1000 ng/mL 300  MDMA (Ecstasy), urine           Cutoff 500 ng/mL 400  Cocaine Metabolite, urine       Cutoff 300 ng/mL 500  Opiate, urine                   Cutoff 300 ng/mL 600  Phencyclidine (PCP), urine      Cutoff 25 ng/mL 700  Cannabinoid, urine              Cutoff 50 ng/mL 800  Barbiturates, urine             Cutoff 200 ng/mL 900  Benzodiazepine, urine           Cutoff 200 ng/mL 1000 Methadone, urine                Cutoff 300 ng/mL 1100 1200 The urine drug screen provides only a preliminary, unconfirmed 1300 analytical test result and should not be used for non-medical 1400 purposes. Clinical consideration and professional judgment should 1500 be applied to any positive drug screen result due to possible 1600 interfering substances. A more specific alternate chemical method 1700 must be used in order to obtain a confirmed analytical result.  1800 Gas chromato graphy / mass spectrometry (GC/MS) is the preferred 1900 confirmatory method.    Dg Chest 2 View  Result Date: 08/06/2016 CLINICAL DATA:  Sudden onset sharp chest pain radiating to the back. Left arm pain. Onset 1 hour ago. Shortness of breath and nausea. EXAM: CHEST  2 VIEW COMPARISON:  03/04/2014 FINDINGS: The heart  size and mediastinal contours are within normal limits. Both lungs are clear. The visualized skeletal structures are unremarkable. IMPRESSION: No active cardiopulmonary disease. Electronically Signed   By: Lucienne Capers M.D.   On: 08/06/2016 23:50   Ct Angio Chest Pe W And/or Wo Contrast  Result Date: 08/07/2016 CLINICAL DATA:  Acute onset of sharp mid chest pain, radiating to the back, with left arm pain. Shortness of breath and nausea. Initial encounter. EXAM: CT ANGIOGRAPHY CHEST WITH CONTRAST TECHNIQUE: Multidetector CT imaging of the chest was performed using the standard protocol during  bolus administration of intravenous contrast. Multiplanar CT image reconstructions and MIPs were obtained to evaluate the vascular anatomy. CONTRAST:  75 mL of Isovue 370 IV contrast COMPARISON:  Chest radiograph performed 08/06/2016 FINDINGS: There is no evidence of pulmonary embolus. Minimal bibasilar atelectasis is noted. Minimal scattered peripheral opacities are seen bilaterally, raising concern for an acute infectious process. There is no evidence of pleural effusion or pneumothorax. No masses are identified; no abnormal focal contrast enhancement is seen. The mediastinum is unremarkable in appearance. No mediastinal lymphadenopathy is seen. No pericardial effusion is identified. The great vessels are grossly unremarkable in appearance. No axillary lymphadenopathy is seen. The visualized portions of the thyroid gland are unremarkable in appearance. The visualized portions of the liver and spleen are unremarkable. No acute osseous abnormalities are seen. Review of the MIP images confirms the above findings. IMPRESSION: 1. No evidence of pulmonary embolus. 2. Minimal scattered peripheral opacities seen bilaterally, concerning for an acute infectious process. 3. Minimal bibasilar atelectasis noted. Electronically Signed   By: Garald Balding M.D.   On: 08/07/2016 02:40    Review of Systems  Constitutional: Negative  for chills and fever.  HENT: Negative for sore throat and tinnitus.   Eyes: Negative for blurred vision and redness.  Respiratory: Positive for shortness of breath (on exertion). Negative for cough.   Cardiovascular: Positive for chest pain. Negative for palpitations, orthopnea and PND.  Gastrointestinal: Negative for abdominal pain, diarrhea, nausea and vomiting.  Genitourinary: Negative for dysuria, frequency and urgency.  Musculoskeletal: Negative for joint pain and myalgias.  Skin: Negative for rash.       No lesions  Neurological: Negative for speech change, focal weakness and weakness.  Endo/Heme/Allergies: Does not bruise/bleed easily.       No temperature intolerance  Psychiatric/Behavioral: Negative for depression and suicidal ideas.    Blood pressure 112/63, pulse 66, temperature 98.1 F (36.7 C), temperature source Oral, resp. rate 15, height '5\' 3"'$  (1.6 m), weight 76.2 kg (168 lb), last menstrual period 12/26/2011, SpO2 99 %. Physical Exam  Vitals reviewed. Constitutional: She is oriented to person, place, and time. She appears well-developed and well-nourished. No distress.  HENT:  Head: Normocephalic and atraumatic.  Mouth/Throat: Oropharynx is clear and moist.  Eyes: Conjunctivae and EOM are normal. Pupils are equal, round, and reactive to light. No scleral icterus.  Neck: Normal range of motion. Neck supple. No JVD present. No tracheal deviation present. No thyromegaly present.  Cardiovascular: Normal rate, regular rhythm and normal heart sounds.  Exam reveals no gallop and no friction rub.   No murmur heard. Respiratory: Effort normal and breath sounds normal.  GI: Soft. Bowel sounds are normal. She exhibits no distension. There is no tenderness.  Genitourinary:  Genitourinary Comments: Deferred  Musculoskeletal: Normal range of motion. She exhibits no edema.  Lymphadenopathy:    She has no cervical adenopathy.  Neurological: She is alert and oriented to person,  place, and time. No cranial nerve deficit. She exhibits normal muscle tone.  Skin: Skin is warm and dry. No rash noted. No erythema.  Psychiatric: She has a normal mood and affect. Her behavior is normal. Judgment and thought content normal.     Assessment/Plan This is a 40 year old female admitted for chest pain. 1. Chest pain: Atypical in duration; unlikely ACS. EKG shows normal sinus rhythm without evidence of myocardial ischemia. First two troponins are negative. Continue to monitor telemetry and follow cardiac biomarkers. Cardiology consultation at discretion of primary team. Continue aspirin. 2. Fatigue: Reported  autoimmune disorder; may also be related to decrease myocardial function secondary to previous drug use. Consider echocardiogram although the patient has no signs or symptoms of congestive heart failure at this time. 3. Depression: Stable; continue bupropion and fluoxetine. Klonopin as needed. 4. Tobacco abuse: NicoDerm patch 5. DVT prophylaxis: Lovenox 6. GI prophylaxis: None The patient is a full code. Time spent on admission orders and patient care proximally 45 minutes  Harrie Foreman, MD 08/07/2016, 7:35 AM

## 2016-08-07 NOTE — ED Notes (Signed)
Dr. Duke Salviaandolph notified of 325 D-dimer. Lab to result after re-running sample.

## 2016-08-07 NOTE — ED Notes (Signed)
Pt stating that pain is coming and going. "feels like a pulled muscle." Burning/pulling sensation.

## 2016-08-07 NOTE — ED Notes (Signed)
Pt stating pain began 2 hours PTA. Pt stating that it is sternal to left Cp, with nausea, and SOB. Pt stating that some pain has decreased since start but it is persistent at an 8 out of 10. 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 Pt is calm at this time and appears to be in no significant distress at this time. No edema noted. VS WNL

## 2016-08-07 NOTE — Progress Notes (Signed)
[facility] [place] at [facility]                                                                                                                                                                                            Patient Demographics   Shannon Campbell, is a 40 y.o. female, DOB - Nov 21, 1976, ZOX:[id number]  Admit date - 08/06/2016   Admitting Physician Arnaldo Natal, MD  Outpatient Primary MD for the patient is Margaretann Loveless, MD   LOS - 0  Subjective:Patient continues to complain of left-sided chest pain. She is also complaining of some shortness of breath and cough. CT of the chest showed possible early atypical pneumonia     Review of Systems:   CONSTITUTIONAL: No documented fever. No fatigue, weakness. No weight gain, no weight loss.  EYES: No blurry or double vision.  ENT: No tinnitus. No postnasal drip. No redness of the oropharynx.  RESPIRATORY: Positive cough, no wheeze, no hemoptysis. No dyspnea.  CARDIOVASCULAR: Positive chest pain. No orthopnea. No palpitations. No syncope.  GASTROINTESTINAL: No nausea, no vomiting or diarrhea. No abdominal pain. No melena or hematochezia.  GENITOURINARY: No dysuria or hematuria.  ENDOCRINE: No polyuria or nocturia. No heat or cold intolerance.  HEMATOLOGY: No anemia. No bruising. No bleeding.  INTEGUMENTARY: No rashes. No lesions.  MUSCULOSKELETAL: No arthritis. No swelling. No gout.  NEUROLOGIC: No numbness, tingling, or ataxia. No seizure-type activity.  PSYCHIATRIC: No anxiety. No insomnia. No ADD.    Vitals:   Vitals:   08/07/16 0500 08/07/16 0530 08/07/16 0557 08/07/16 0855  BP: 120/81 113/67 112/63 104/71  Pulse: 69 64 66 (!) 58  Resp: 17 (!) 24 15   Temp:   98.1 F (36.7 C)   TempSrc:   Oral   SpO2: 98% 96% 99% 96%  Weight:      Height:        Wt Readings from Last 3 Encounters:  08/07/16 76.2 kg (168 lb)  07/08/15 74.8 kg (164 lb 12.8 oz)     Intake/Output Summary (Last 24  hours) at 08/07/16 1111 Last data filed at 08/07/16 1019  Gross per 24 hour  Intake                0 ml  Output                0 ml  Net                0 ml    Physical Exam:   GENERAL: Pleasant-appearing in no apparent distress.  HEAD, EYES, EARS, NOSE AND THROAT: Atraumatic, normocephalic. Extraocular  muscles are intact. Pupils equal and reactive to light. Sclerae anicteric. No conjunctival injection. No oro-pharyngeal erythema.  NECK: Supple. There is no jugular venous distention. No bruits, no lymphadenopathy, no thyromegaly.  HEART: Regular rate and rhythm,. No murmurs, no rubs, no clicks.  LUNGS: Decreased breath sounds bilaterally. No rales or rhonchi. No wheezes.  ABDOMEN: Soft, flat, nontender, nondistended. Has good bowel sounds. No hepatosplenomegaly appreciated.  EXTREMITIES: No evidence of any cyanosis, clubbing, or peripheral edema.  +2 pedal and radial pulses bilaterally.  NEUROLOGIC: The patient is alert, awake, and oriented x3 with no focal motor or sensory deficits appreciated bilaterally.  SKIN: Moist and warm with no rashes appreciated.  Psych: Not anxious, depressed LN: No inguinal LN enlargement    Antibiotics   Anti-infectives    Start     Dose/Rate Route Frequency Ordered Stop   08/07/16 0900  levofloxacin (LEVAQUIN) IVPB 500 mg     500 mg 100 mL/hr over 60 Minutes Intravenous Every 24 hours 08/07/16 0822        Medications   Scheduled Meds: . aspirin EC  81 mg Oral Daily  . buPROPion  150 mg Oral Daily  . clonazePAM  0.5 mg Oral QHS  . docusate sodium  100 mg Oral BID  . enoxaparin (LOVENOX) injection  40 mg Subcutaneous Q24H  . FLUoxetine  20 mg Oral Daily  . guaiFENesin  600 mg Oral BID  . levofloxacin (LEVAQUIN) IV  500 mg Intravenous Q24H  . nicotine  21 mg Transdermal Daily  . omega-3 acid ethyl esters  1 g Oral Daily  . sodium chloride flush  3 mL Intravenous Q12H   Continuous Infusions:  PRN Meds:.acetaminophen **OR** acetaminophen,  HYDROcodone-acetaminophen, morphine injection, nitroGLYCERIN, ondansetron **OR** ondansetron (ZOFRAN) IV, technetium tetrofosmin   Data Review:   Micro Results No results found for this or any previous visit (from the past 240 hour(s)).  Radiology Reports Dg Chest 2 View  Result Date: 08/06/2016 CLINICAL DATA:  Sudden onset sharp chest pain radiating to the back. Left arm pain. Onset 1 hour ago. Shortness of breath and nausea. EXAM: CHEST  2 VIEW COMPARISON:  03/04/2014 FINDINGS: The heart size and mediastinal contours are within normal limits. Both lungs are clear. The visualized skeletal structures are unremarkable. IMPRESSION: No active cardiopulmonary disease. Electronically Signed   By: Burman NievesWilliam  Stevens M.D.   On: 08/06/2016 23:50   Ct Angio Chest Pe W And/or Wo Contrast  Result Date: 08/07/2016 CLINICAL DATA:  Acute onset of sharp mid chest pain, radiating to the back, with left arm pain. Shortness of breath and nausea. Initial encounter. EXAM: CT ANGIOGRAPHY CHEST WITH CONTRAST TECHNIQUE: Multidetector CT imaging of the chest was performed using the standard protocol during bolus administration of intravenous contrast. Multiplanar CT image reconstructions and MIPs were obtained to evaluate the vascular anatomy. CONTRAST:  75 mL of Isovue 370 IV contrast COMPARISON:  Chest radiograph performed 08/06/2016 FINDINGS: There is no evidence of pulmonary embolus. Minimal bibasilar atelectasis is noted. Minimal scattered peripheral opacities are seen bilaterally, raising concern for an acute infectious process. There is no evidence of pleural effusion or pneumothorax. No masses are identified; no abnormal focal contrast enhancement is seen. The mediastinum is unremarkable in appearance. No mediastinal lymphadenopathy is seen. No pericardial effusion is identified. The great vessels are grossly unremarkable in appearance. No axillary lymphadenopathy is seen. The visualized portions of the thyroid gland  are unremarkable in appearance. The visualized portions of the liver and spleen are unremarkable. No acute  osseous abnormalities are seen. Review of the MIP images confirms the above findings. IMPRESSION: 1. No evidence of pulmonary embolus. 2. Minimal scattered peripheral opacities seen bilaterally, concerning for an acute infectious process. 3. Minimal bibasilar atelectasis noted. Electronically Signed   By: Roanna RaiderJeffery  Chang M.D.   On: 08/07/2016 02:40     CBC  Recent Labs Lab 08/06/16 2305  WBC 8.8  HGB 14.2  HCT 40.1  PLT 262  MCV 90.2  MCH 31.9  MCHC 35.4  RDW 12.7    Chemistries   Recent Labs Lab 08/06/16 2305  NA 138  K 3.6  CL 106  CO2 27  GLUCOSE 71  BUN 10  CREATININE 0.87  CALCIUM 8.7*   ------------------------------------------------------------------------------------------------------------------ estimated creatinine clearance is 86.5 mL/min (by C-G formula based on SCr of 0.87 mg/dL). ------------------------------------------------------------------------------------------------------------------ No results for input(s): HGBA1C in the last 72 hours. ------------------------------------------------------------------------------------------------------------------ No results for input(s): CHOL, HDL, LDLCALC, TRIG, CHOLHDL, LDLDIRECT in the last 72 hours. ------------------------------------------------------------------------------------------------------------------  Recent Labs  08/07/16 0626  TSH 3.537   ------------------------------------------------------------------------------------------------------------------ No results for input(s): VITAMINB12, FOLATE, FERRITIN, TIBC, IRON, RETICCTPCT in the last 72 hours.  Coagulation profile No results for input(s): INR, PROTIME in the last 168 hours.  No results for input(s): DDIMER in the last 72 hours.  Cardiac Enzymes  Recent Labs Lab 08/06/16 2305 08/07/16 0144 08/07/16 0626  TROPONINI <0.03  <0.03 <0.03   ------------------------------------------------------------------------------------------------------------------ Invalid input(s): POCBNP    Assessment & Plan   This is a 40 year old female admitted for chest pain. 1. Chest pain: Atypical Due to persistent symptoms I will go ahead and obtain a stress test for later today 2. Atypical pneumonia we'll start patient on Levaquin 3. Depression: Stable; continue bupropion and fluoxetine. Klonopin as needed. 4. Tobacco abuse: NicoDerm patch 5. DVT prophylaxis: Lovenox 6. GI prophylaxis: None     Code Status Orders        Start     Ordered   08/07/16 0558  Full code  Continuous     08/07/16 0557    Code Status History    Date Active Date Inactive Code Status Order ID Comments User Context   This patient has a current code status but no historical code status.           Consults none  DVT Prophylaxis  Lovenox   Lab Results  Component Value Date   PLT 262 08/06/2016     Time Spent in minutes   45min  Greater than 50% of time spent in care coordination and counseling patient regarding the condition and plan of care.   Auburn BilberryPATEL, Rileyann Florance M.D on 08/07/2016 at 11:11 AM  Between 7am to 6pm - Pager - [phone]  After 6pm go to [email or web address] - password [email or web address] [facility]  [facility]   Office  [phone]

## 2016-08-07 NOTE — Progress Notes (Signed)
Pt. Complains of fishy odor when urinating urinalysis previously obtained also complains of irritation under both breast. Dr. Allena KatzPatel made aware new orders for fluconazole 100 mg x1 and nystatin powder BID

## 2016-08-07 NOTE — ED Notes (Signed)
Pt updated on admission process. Pt verbalizes understanding.  

## 2016-08-07 NOTE — ED Notes (Signed)
Introduced self to pt, pt updated on treatment process. Pt verbalizes understanding, denies further needs.

## 2016-08-07 NOTE — ED Notes (Addendum)
Transport to 253

## 2016-08-07 NOTE — Progress Notes (Signed)
Patient admitted to telemetry. Admission information and plan of care explained to patient. Verbalized understanding. Explained to patient how to contact staff via call bell and telephone. Verbalized understanding. Telemetry box verified with Chief Operating OfficerAlisha RN. Patient having chest pain. Medication administered. Will continue to monitor,

## 2016-08-07 NOTE — ED Notes (Signed)
Pt updated on process of ct results. Pt verbalizes understanding. Pt voices continued chest pain of 8/10 but relates will await md revaluation and discussion of ct results prior to additional pain medication.

## 2016-08-07 NOTE — Care Management (Signed)
Placed in observation for chest pain.  At present, no discharge needs identified by  care team members.  Stress test planned and on levaquin for possible pneumonia

## 2016-08-08 DIAGNOSIS — R079 Chest pain, unspecified: Secondary | ICD-10-CM | POA: Diagnosis not present

## 2016-08-08 MED ORDER — LEVOFLOXACIN 500 MG PO TABS: 500.0000 mg | ORAL_TABLET | Freq: Every day | ORAL | 0 refills | Status: AC

## 2016-08-08 MED ORDER — LEVOFLOXACIN 500 MG PO TABS: 500.0000 mg | ORAL_TABLET | Freq: Every day | ORAL | 0 refills | Status: DC

## 2016-08-08 NOTE — Progress Notes (Signed)
Discharge instructions given. IV and tele removed. Prescription given to patient along with education. No questions at this time, verbalized understanding. Transportation is here to take her home.

## 2016-08-08 NOTE — Discharge Summary (Signed)
Shannon Campbell, 40 y.o., DOB 1976-12-14, MRN [id number]. 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 Patient does have previous history of cardiac arrest secondary to heroin therefore she was admitted to the hospital for observation. Serial cardiac enzymes were done which remained negative. She underwent  stress MIBI which was negative. Patient also had a CT of the chest which showed some atypical pneumonia which was treated with oral antibiotics. She is complaining of feeling fatigued but other than that no other complaints she is stable for discharge             Consults  None  Significant Tests:  See full reports for all details     Dg Chest 2 View  Result Date: 08/06/2016 CLINICAL DATA:  Sudden onset sharp chest pain radiating to the back. Left arm pain. Onset 1 hour ago. Shortness of breath and nausea. EXAM: CHEST  2 VIEW COMPARISON:  03/04/2014 FINDINGS: The heart size and mediastinal contours are within normal limits. Both lungs are clear. The visualized skeletal structures are unremarkable. IMPRESSION: No active cardiopulmonary disease. Electronically Signed   By: Burman NievesWilliam  Stevens M.D.   On: 08/06/2016 23:50   Ct Angio Chest Pe W And/or Wo Contrast  Result Date: 08/07/2016 CLINICAL DATA:  Acute onset of sharp mid chest pain, radiating to the back, with left arm pain. Shortness of breath and nausea. Initial encounter. EXAM: CT ANGIOGRAPHY CHEST WITH CONTRAST TECHNIQUE: Multidetector CT imaging of the chest was performed using the standard protocol during  bolus administration of intravenous contrast. Multiplanar CT image reconstructions and MIPs were obtained to evaluate the vascular anatomy. CONTRAST:  75 mL of Isovue 370 IV contrast COMPARISON:  Chest radiograph performed 08/06/2016 FINDINGS: There is no evidence of pulmonary embolus. Minimal bibasilar atelectasis is noted. Minimal scattered peripheral opacities are seen bilaterally, raising concern for an acute infectious process. There is no evidence of pleural effusion or pneumothorax. No masses are identified; no abnormal focal contrast enhancement is seen. The mediastinum is unremarkable in appearance. No mediastinal lymphadenopathy is seen. No pericardial effusion is identified. The great vessels are grossly unremarkable in appearance. No axillary lymphadenopathy is seen. The visualized portions of the thyroid gland are unremarkable in appearance. The visualized portions of the liver and spleen are unremarkable. No acute osseous abnormalities are seen. Review of the MIP images confirms the above findings. IMPRESSION: 1. No evidence of pulmonary embolus. 2. Minimal scattered peripheral opacities seen bilaterally, concerning for an acute infectious process. 3. Minimal bibasilar atelectasis noted. Electronically Signed   By: Roanna RaiderJeffery  Chang M.D.   On: 08/07/2016 02:40   Nm Myocar Multi W/spect W/wall Motion / Ef  Result Date: 08/07/2016  The study is normal.  The left ventricular ejection fraction is moderately decreased (30-44%).  This is a low risk study.        Today   Subjective:   Shannon Campbell  Asian feels well denies any complaint except being tired  Objective:   Blood pressure (!) 102/56, pulse 75, temperature 98.2 F (36.8 C), temperature source Oral, resp. rate 16, height 5'  3" (1.6 m), weight 78.2 kg (172 lb 8 oz), last menstrual period 12/26/2011, SpO2 99 %.  .  Intake/Output Summary (Last 24 hours) at 08/08/16 1434 Last data filed at 08/08/16 0900  Gross per 24 hour   Intake                0 ml  Output                0 ml  Net                0 ml    Exam VITAL SIGNS: Blood pressure (!) 102/56, pulse 75, temperature 98.2 F (36.8 C), temperature source Oral, resp. rate 16, height 5\' 3"  (1.6 m), weight 78.2 kg (172 lb 8 oz), last menstrual period 12/26/2011, SpO2 99 %.  GENERAL:  40 y.o.-year-old patient lying in the bed with no acute distress.  EYES: Pupils equal, round, reactive to light and accommodation. No scleral icterus. Extraocular muscles intact.  HEENT: Head atraumatic, normocephalic. Oropharynx and nasopharynx clear.  NECK:  Supple, no jugular venous distention. No thyroid enlargement, no tenderness.  LUNGS: Normal breath sounds bilaterally, no wheezing, rales,rhonchi or crepitation. No use of accessory muscles of respiration.  CARDIOVASCULAR: S1, S2 normal. No murmurs, rubs, or gallops.  ABDOMEN: Soft, nontender, nondistended. Bowel sounds present. No organomegaly or mass.  EXTREMITIES: No pedal edema, cyanosis, or clubbing.  NEUROLOGIC: Cranial nerves II through XII are intact. Muscle strength 5/5 in all extremities. Sensation intact. Gait not checked.  PSYCHIATRIC: The patient is alert and oriented x 3.  SKIN: No obvious rash, lesion, or ulcer.   Data Review     CBC w Diff: Lab Results  Component Value Date   WBC 8.8 08/06/2016   HGB 14.2 08/06/2016   HGB 15.0 03/04/2014   HCT 40.1 08/06/2016   HCT 43.2 03/04/2014   PLT 262 08/06/2016   PLT 236 03/04/2014   CMP: Lab Results  Component Value Date   NA 138 08/06/2016   NA 139 03/04/2014   K 3.6 08/06/2016   K 4.2 03/04/2014   CL 106 08/06/2016   CL 109 (H) 03/04/2014   CO2 27 08/06/2016   CO2 27 03/04/2014   BUN 10 08/06/2016   BUN 7 03/04/2014   CREATININE 0.87 08/06/2016   CREATININE 0.80 03/04/2014   PROT 6.9 06/27/2012   ALBUMIN 3.5 06/27/2012   BILITOT 0.6 06/27/2012   ALKPHOS 115 06/27/2012   AST 25 06/27/2012   ALT 30 06/27/2012  .  Micro Results No  results found for this or any previous visit (from the past 240 hour(s)).      Code Status Orders        Start     Ordered   08/07/16 0558  Full code  Continuous     08/07/16 0557    Code Status History    Date Active Date Inactive Code Status Order ID Comments User Context   This patient has a current code status but no historical code status.          Follow-up Information    Margaretann LovelessKHAN,NEELAM S, MD Follow up in 7 day(s).   Specialty:  Internal Medicine Why:  Wednesday, August 23rd at 1030am, [facility] Contact information: [address] [place] [id number] [phone]           Discharge Medications     Medication List    TAKE these medications   buPROPion 150 MG 24 hr tablet Commonly known as:  WELLBUTRIN XL Take 150 mg by mouth daily.   clonazePAM 0.5 MG tablet Commonly known as:  KLONOPIN Take 0.5 mg by mouth at bedtime.   Fish Oil 1000 MG Caps Take 1,000 mg by mouth daily.   FLUoxetine 20 MG capsule Commonly known as:  PROZAC Take 1 capsule (20 mg total) by mouth daily. Begin with 1/2 tablet daily for 2 weeks, if symptoms unrelieved, can increase to 1 whole tablet.  To take for 2 weeks prior to menses each month. What changed:  additional instructions   HYDROcodone-acetaminophen 5-325 MG tablet Commonly known as:  NORCO/VICODIN Take 1 tablet by mouth every 6 (six) hours as needed for moderate pain.   levofloxacin 500 MG tablet Commonly known as:  LEVAQUIN Take 1 tablet (500 mg total) by mouth daily.   methylphenidate 20 MG CR capsule Commonly known as:  METADATE CD Take 20 mg by mouth 2 (two) times daily.   norethindrone 0.35 MG tablet Commonly known as:  MICRONOR,CAMILA,ERRIN Take 1 tablet (0.35 mg total) by mouth daily.   omega-3 acid ethyl esters 1 g capsule Commonly known as:  LOVAZA Take by mouth 2 (two) times daily.          Total Time in preparing paper work, data evaluation and todays exam - 35 minutes  Auburn Bilberry M.D  on 08/08/2016 at 2:34 PM  [facility]   Office  [phone]

## 2016-08-09 DIAGNOSIS — N879 Dysplasia of cervix uteri, unspecified: Secondary | ICD-10-CM | POA: Insufficient documentation

## 2016-09-12 ENCOUNTER — Ambulatory Visit: Payer: Medicaid Other | Admitting: Obstetrics and Gynecology

## 2016-09-19 ENCOUNTER — Encounter: Payer: Self-pay | Admitting: Obstetrics and Gynecology

## 2016-09-19 ENCOUNTER — Ambulatory Visit (INDEPENDENT_AMBULATORY_CARE_PROVIDER_SITE_OTHER): Payer: Medicare Other | Admitting: Obstetrics and Gynecology

## 2016-09-19 VITALS — BP 115/74 | HR 93 | Ht 63.0 in | Wt 173.9 lb

## 2016-09-19 DIAGNOSIS — R339 Retention of urine, unspecified: Secondary | ICD-10-CM

## 2016-09-19 DIAGNOSIS — R1032 Left lower quadrant pain: Secondary | ICD-10-CM | POA: Diagnosis not present

## 2016-09-19 DIAGNOSIS — Z8742 Personal history of other diseases of the female genital tract: Secondary | ICD-10-CM

## 2016-09-19 LAB — POCT URINALYSIS DIPSTICK
BILIRUBIN UA: NEGATIVE
GLUCOSE UA: NEGATIVE
KETONES UA: NEGATIVE
Leukocytes, UA: NEGATIVE
Nitrite, UA: NEGATIVE
PH UA: 6
Protein, UA: NEGATIVE
RBC UA: NEGATIVE
Spec Grav, UA: 1.025
Urobilinogen, UA: NEGATIVE

## 2016-09-19 MED ORDER — HYDROMORPHONE HCL 2 MG PO TABS: 2.0000 mg | ORAL_TABLET | Freq: Four times a day (QID) | ORAL | 0 refills | Status: DC | PRN

## 2016-09-19 NOTE — Progress Notes (Addendum)
GYNECOLOGY PROGRESS NOTE  Subjective:    Patient ID: Shannon Campbell, female    DOB: 1976-08-22, 40 y.o.   MRN: [id number]  HPI  Patient is a 40 y.o. [id number] female who presents for for complaints of LLQ pain.  Notes that [facility] for RLQ pain and was diagnosed with bilateral ovarian cyst.  Notes that the pain waxed and waned for ~ 2-3 weeks and went away, but has now returned on the left side and is more constant.  Pain currently at a 4, but can be as high as an 8/10.    In addition, patient complains of feelings of incomplete bladder emptying. Denies holding bladder for prolonged times, reports adequate hydration. Denies dysuria, hematuria, or h/o kidney stones.  Does report a urinary or possible vaginal odor ~ 1 week ago that went away. Denies vaginal discharge.   The following portions of the patient's history were reviewed and updated as appropriate: allergies, current medications, past family history, past medical history, past social history, past surgical history and problem list.  Review of Systems Pertinent items noted in HPI and remainder of comprehensive ROS otherwise negative.   Objective:   Blood pressure 115/74, pulse 93, height 5\' 3"  (1.6 m), weight 173 lb 14.4 oz (78.9 kg), last menstrual period 12/26/2011. General appearance: alert and no distress Abdomen: normal findings: no masses palpable, no organomegaly and soft. and abnormal findings:  mild tenderness in midline near suprapubic region and in LLQ Pelvic: external genitalia normal, rectovaginal septum normal.  Vagina with small amount of thin white discharge.  Cervix normal appearing, no lesions and no motion tenderness.  Uterus mobile, nontender, normal shape and size.  Adnexae non-palpable,left side mildly tender, right side non-tender.   Extremities: extremities normal, atraumatic, no cyanosis or edema Neurologic: Grossly normal   Microscopic wet-mount exam shows negative for pathogens,  normal epithelial cells.   Imaging (Pelvic Ultrasound 07/23/3016):   CLINICAL INDICATION: 40 years old Female with RLQ tenderness--   COMPARISON: None  TECHNIQUE:Ultrasound views of the pelvis were obtained endovaginally using gray scale and color and spectral Doppler imaging.  FINDINGS: The uterus is surgically absent.  Evaluation of the left ovary is limited due to adjacent bowel gas. Small cystic areas were seen within both ovaries compatible with follicles.Appropriate flow was seen in both ovaries with color and spectral doppler. A 1.8 x 1.7 x 1.6 cm complex hypoechoic lesions identified of the right ovary, with possible vascular thin septation versus intervening ovarian tissue between 2 adjacent follicles. A 2 x 1.8 x 1.4 cm heterogeneous hypoechoic lesion in the left ovary is identified with peripheral rim, likely representing a hemorrhagic cyst or corpus luteum, poorly visualized due to adjacent bowel gas.   No abnormal pelvic fluid was seen.   Assessment:   LLQ pain  H/o bilateral ovarian cysts (small) Incomplete bladder emptying  Plan:   - Patient with h/o bilateral ovarian cysts, has not had any f/u since the end of July.  Will order pelvic ultrasound to reassess for presence or possible enlargement/rupture of cysts due to new onset LLQ pain.  Will notify patient of results by phone. Discussed that if cyst still present and is larger, she would benefit from short term hormonal treatment.  Will prescribe progesterone OCPs (patient is over 35, smoker) x 6 weeks.  ,  - Prescription given for Dilaudid (notes she was prescribed Percocet in the past, which only offered modest relief).  Also encouraged to  alternate with Ibuprofen.  - Incomplete bladder emptying and recent urine odor, will order UA.  Performed straight catheterization since patient voided earlier during today's visit to assess residual.  Only 100 cc of urine recovered.  Patient emptying well.   - RTC in 1 month for  follow up.    Hildred Laser, MD [facility]

## 2016-09-26 ENCOUNTER — Ambulatory Visit (INDEPENDENT_AMBULATORY_CARE_PROVIDER_SITE_OTHER): Payer: Medicare Other

## 2016-09-26 DIAGNOSIS — R1032 Left lower quadrant pain: Secondary | ICD-10-CM | POA: Diagnosis not present

## 2016-09-26 DIAGNOSIS — Z8742 Personal history of other diseases of the female genital tract: Secondary | ICD-10-CM | POA: Diagnosis not present

## 2016-10-06 ENCOUNTER — Emergency Department
Admission: EM | Admit: 2016-10-06 | Discharge: 2016-10-06 | Disposition: A | Discharge status: Home / Self Care | Payer: Medicare Other | Attending: Student | Admitting: Student

## 2016-10-06 ENCOUNTER — Emergency Department: Payer: Medicare Other

## 2016-10-06 DIAGNOSIS — F1919 Other psychoactive substance abuse with unspecified psychoactive substance-induced disorder: Secondary | ICD-10-CM | POA: Insufficient documentation

## 2016-10-06 DIAGNOSIS — F17210 Nicotine dependence, cigarettes, uncomplicated: Secondary | ICD-10-CM | POA: Diagnosis not present

## 2016-10-06 DIAGNOSIS — J069 Acute upper respiratory infection, unspecified: Secondary | ICD-10-CM | POA: Diagnosis not present

## 2016-10-06 DIAGNOSIS — Z79899 Other long term (current) drug therapy: Secondary | ICD-10-CM | POA: Insufficient documentation

## 2016-10-06 DIAGNOSIS — F1029 Alcohol dependence with unspecified alcohol-induced disorder: Secondary | ICD-10-CM | POA: Diagnosis present

## 2016-10-06 DIAGNOSIS — J45909 Unspecified asthma, uncomplicated: Secondary | ICD-10-CM | POA: Insufficient documentation

## 2016-10-06 DIAGNOSIS — F1910 Other psychoactive substance abuse, uncomplicated: Secondary | ICD-10-CM

## 2016-10-06 LAB — CBC WITH DIFFERENTIAL/PLATELET
BASOS PCT: 1 %
Basophils Absolute: 0.1 10*3/uL (ref 0–0.1)
Eosinophils Absolute: 0 10*3/uL (ref 0–0.7)
Eosinophils Relative: 0 %
HEMATOCRIT: 39.6 % (ref 35.0–47.0)
Hemoglobin: 13.9 g/dL (ref 12.0–16.0)
LYMPHS ABS: 1.4 10*3/uL (ref 1.0–3.6)
LYMPHS PCT: 15 %
MCH: 31.5 pg (ref 26.0–34.0)
MCHC: 35.2 g/dL (ref 32.0–36.0)
MCV: 89.5 fL (ref 80.0–100.0)
MONO ABS: 0.5 10*3/uL (ref 0.2–0.9)
MONOS PCT: 6 %
NEUTROS ABS: 7.3 10*3/uL — AB (ref 1.4–6.5)
Neutrophils Relative %: 78 %
Platelets: 283 10*3/uL (ref 150–440)
RBC: 4.43 MIL/uL (ref 3.80–5.20)
RDW: 11.7 % (ref 11.5–14.5)
WBC: 9.3 10*3/uL (ref 3.6–11.0)

## 2016-10-06 LAB — COMPREHENSIVE METABOLIC PANEL
ALT: 38 U/L (ref 14–54)
ANION GAP: 9 (ref 5–15)
AST: 36 U/L (ref 15–41)
Albumin: 3.8 g/dL (ref 3.5–5.0)
Alkaline Phosphatase: 128 U/L — ABNORMAL HIGH (ref 38–126)
BILIRUBIN TOTAL: 1.4 mg/dL — AB (ref 0.3–1.2)
BUN: 11 mg/dL (ref 6–20)
CO2: 22 mmol/L (ref 22–32)
Calcium: 8.4 mg/dL — ABNORMAL LOW (ref 8.9–10.3)
Chloride: 106 mmol/L (ref 101–111)
Creatinine, Ser: 0.7 mg/dL (ref 0.44–1.00)
Glucose, Bld: 118 mg/dL — ABNORMAL HIGH (ref 65–99)
POTASSIUM: 3.1 mmol/L — AB (ref 3.5–5.1)
Sodium: 137 mmol/L (ref 135–145)
TOTAL PROTEIN: 7.3 g/dL (ref 6.5–8.1)

## 2016-10-06 LAB — URINE DRUG SCREEN, QUALITATIVE (ARMC ONLY)
Amphetamines, Ur Screen: NOT DETECTED
BARBITURATES, UR SCREEN: NOT DETECTED
Benzodiazepine, Ur Scrn: POSITIVE — AB
CANNABINOID 50 NG, UR ~~LOC~~: NOT DETECTED
Cocaine Metabolite,Ur ~~LOC~~: POSITIVE — AB
MDMA (Ecstasy)Ur Screen: NOT DETECTED
Methadone Scn, Ur: NOT DETECTED
Opiate, Ur Screen: NOT DETECTED
PHENCYCLIDINE (PCP) UR S: NOT DETECTED
TRICYCLIC, UR SCREEN: NOT DETECTED

## 2016-10-06 LAB — ACETAMINOPHEN LEVEL: Acetaminophen (Tylenol), Serum: <10 ug/mL — ABNORMAL LOW (ref 10–30)

## 2016-10-06 LAB — TROPONIN I

## 2016-10-06 LAB — RAPID HIV SCREEN (HIV 1/2 AB+AG)
HIV 1/2 Antibodies: NONREACTIVE
HIV-1 P24 Antigen - HIV24: NONREACTIVE

## 2016-10-06 LAB — ETHANOL

## 2016-10-06 LAB — SALICYLATE LEVEL

## 2016-10-06 MED ORDER — NYSTATIN 100000 UNIT/ML MT SUSP: 5.0000 mL | Freq: Four times a day (QID) | OROMUCOSAL | 0 refills | Status: DC

## 2016-10-06 NOTE — ED Notes (Signed)

## 2016-10-06 NOTE — ED Notes (Signed)
MD wanting to do a rapid HIV test for this patient. Blood previously sent down to the lab; test should be able to be added on to the samples already in the lab. [facility] Dr[facility] Inocencio HomesGayle.

## 2016-10-06 NOTE — ED Provider Notes (Signed)
[facility] Emergency Department Provider Note   ____________________________________________   First MD Initiated Contact with Patient 10/06/16 [phone]     (approximate)  I have reviewed the triage vital signs and the nursing notes.   HISTORY  Chief Complaint Addiction Problem    HPI Shannon Campbell is a 40 y.o. female with history of polysubstance abuse, history of cardiac arrest in the setting of a woman overdose in her 1220s, history of "autoimmune disease..I can't remember what is is, it's not lupus, maybe it's fibromyalgia" who presents for evaluation for detox off of alcohol and cocaine, she has been abusing these substances for the past week, gradual onset, constant, severe, no modifying factors patient reports that she had been sober for 4-1/2 years but one week ago she relapsed and began using crack and alcohol. She also reports that she feels like she has "pneumonia again" she reports "I've just come down with something and been so sick". Her chest pain does not radiate to the back or down towards the feet and is not pleuritic. She has had cough, chest pain with cough, hoarse voice and subjective fevers and chills. she reports that she has developed thrush which she has had previously when she drinks alcohol.   Past Medical History:  Diagnosis Date  . Anxiety   . Asthma   . Depression   . Fibromyalgia   . History of cardiac arrest   . Pap smear abnormality of cervix with LGSIL   . VAIN (vaginal intraepithelial neoplasia)     Patient Active Problem List   Diagnosis Date Noted  . Chest pain 08/07/2016  . PMS (premenstrual syndrome) 07/09/2015  . Tobacco abuse 07/09/2015  . Asthma without status asthmaticus 07/08/2015  . Chicken pox 07/08/2015  . Clinical depression 07/08/2015  . 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 Inflamed nasal mucosa 07/08/2015  . Avitaminosis D 07/08/2015    Past Surgical History:  Procedure Laterality Date  .  ABDOMINAL HYSTERECTOMY  2013  . CESAREAN SECTION  1999  . HERNIA REPAIR  2002  . NASAL SINUS SURGERY    . WRIST SURGERY      Prior to Admission medications   Medication Sig Start Date End Date Taking? Authorizing Provider  budesonide-formoterol (SYMBICORT) 80-4.5 MCG/ACT inhaler Inhale 2 puffs into the lungs 2 (two) times daily.   Yes Historical Provider, MD  buPROPion (WELLBUTRIN SR) 200 MG 12 hr tablet Take 200 mg by mouth daily.   Yes Historical Provider, MD  clonazePAM (KLONOPIN) 1 MG tablet Take 1 mg by mouth at bedtime.    Yes Historical Provider, MD  FLUoxetine (PROZAC) 20 MG capsule Take 20 mg by mouth daily.    Yes Historical Provider, MD  HYDROmorphone (DILAUDID) 2 MG tablet Take 1 tablet (2 mg total) by mouth every 6 (six) hours as needed for severe pain. 09/19/16  Yes Hildred LaserAnika Cherry, MD  methylphenidate (METADATE CD) 20 MG CR capsule Take 20 mg by mouth 2 (two) times daily.   Yes Historical Provider, MD  Iloperidone (FANAPT) 1 MG TABS Take 1 tablet by mouth 2 (two) times daily.    Historical Provider, MD  lamoTRIgine (LAMICTAL) 100 MG tablet Take 100 mg by mouth daily.    Historical Provider, MD  lurasidone (LATUDA) 20 MG TABS tablet Take 20 mg by mouth daily.    Historical Provider, MD    Allergies Prednisone  Family History  Problem Relation Age of Onset  . Thyroid disease Mother   . Hypertension Father   .  Hemochromatosis Father     Social History Social History  Substance Use Topics  . Smoking status: Current Every Day Smoker    Packs/day: 1.00    Years: 15.00    Types: Cigarettes  . Smokeless tobacco: Never Used  . Alcohol use Yes     Comment: in recovery for 3.5 years    Review of Systems Constitutional: + subjective fever/chills Eyes: No visual changes. ENT: + sore throat. Cardiovascular: +chest pain with cough Respiratory: Denies shortness of breath. Gastrointestinal: No abdominal pain.  No nausea, no vomiting.  No diarrhea.  No  constipation. Genitourinary: Negative for dysuria. Musculoskeletal: Negative for back pain. Skin: Negative for rash. Neurological: Negative for headaches, focal weakness or numbness.  10-point ROS otherwise negative.  ____________________________________________   PHYSICAL EXAM:  VITAL SIGNS: ED Triage Vitals  Enc Vitals Group     BP 10/06/16 0348 139/82     Pulse Rate 10/06/16 0348 (!) 108     Resp 10/06/16 0348 18     Temp 10/06/16 0348 99.1 F (37.3 C)     Temp Source 10/06/16 0348 Oral     SpO2 10/06/16 0348 95 %     Weight 10/06/16 0350 170 lb (77.1 kg)     Height 10/06/16 0350 5\' 3"  (1.6 m)     Head Circumference --      Peak Flow --      Pain Score 10/06/16 0350 10     Pain Loc --      Pain Edu? --      Excl. in GC? --     Constitutional: Alert and oriented. Well appearing and in no acute distress. Frequent dry cough. Eyes: Conjunctivae are normal. PERRL. EOMI. Head: Atraumatic. Nose: + congestion/rhinnorhea. Mouth/Throat: Mucous membranes are moist.  Oropharynx non-erythematous And nonedematous without exudate. There is some whitish discoloration of the tongue however patient will not allow me to describe it with a tongue blade secondary to her overly active gag reflex. Patient speaking with a hoarse voice which is not muffled, elderly secretions well. Neck: No stridor. Supple without meningismus. Cardiovascular: Mildly tachycardic rate, regular rhythm. Grossly normal heart sounds.  Good peripheral circulation. Respiratory: Normal respiratory effort.  No retractions. Lungs CTAB. Gastrointestinal: Soft and nontender. No distention. No CVA tenderness. Genitourinary: deferred Musculoskeletal: No lower extremity tenderness nor edema.  No joint effusions. Neurologic:  Normal speech and language. No gross focal neurologic deficits are appreciated. No gait instability. Skin:  Skin is warm, dry and intact. No rash noted. Psychiatric: Mood and affect are normal. Speech  and behavior are normal.  ____________________________________________   LABS (all labs ordered are listed, but only abnormal results are displayed)  Labs Reviewed  CBC WITH DIFFERENTIAL/PLATELET - Abnormal; Notable for the following:       Result Value   Neutro Abs 7.3 (*)    All other components within normal limits  COMPREHENSIVE METABOLIC PANEL - Abnormal; Notable for the following:    Potassium 3.1 (*)    Glucose, Bld 118 (*)    Calcium 8.4 (*)    Alkaline Phosphatase 128 (*)    Total Bilirubin 1.4 (*)    All other components within normal limits  ACETAMINOPHEN LEVEL - Abnormal; Notable for the following:    Acetaminophen (Tylenol), Serum <10 (*)    All other components within normal limits  ETHANOL  SALICYLATE LEVEL  URINE DRUG SCREEN, QUALITATIVE ([facility] ONLY)  TROPONIN I  RAPID HIV SCREEN (HIV 1/2 AB+AG)  POC URINE PREG, ED  ____________________________________________  EKG  ED ECG REPORT I, Gayla Doss, the attending physician, personally viewed and interpreted this ECG.   Date: 10/06/2016  EKG Time: 04:30  Rate: 105  Rhythm: sinus tachycardia  Axis: normal  Intervals:none  ST&T Change: No acute ST elevation or acute ST depression.  ____________________________________________  RADIOLOGY  CXR IMPRESSION:  No active cardiopulmonary disease.      ____________________________________________   PROCEDURES  Procedure(s) performed: None  Procedures  Critical Care performed: No  ____________________________________________   INITIAL IMPRESSION / ASSESSMENT AND PLAN / ED COURSE  Pertinent labs & imaging results that were available during my care of the patient were reviewed by me and considered in my medical decision making (see chart for details).  Lovena Kluck is a 40 y.o. female with history of polysubstance abuse, history of cardiac arrest in the setting of a woman overdose in her 33s, history of "autoimmune disease..I can't  remember what is is, it's not lupus, maybe it's fibromyalgia". On exam, she is well-appearing and in no acute distress. Mildly tachycardic but the remainder of her vital signs are stable and she is afebrile. She speaking with a hoarse voice consistent with laryngitis, likely viral but given her complaints, we'll obtain a chest x-ray to rule out pneumonia. She is complaining of pain in her chest with cough and I suspect this is musculoskeletal in nature however given recent cocaine use, we'll obtain troponin, chest x-ray. She does have a whitish substance on her tongue, she will not permit a thorough examination secondary to her gag reflex so I can determine whether not this is actually for rash. Given her history of "autoimmune disease" I have ordered a rapid HIV. She denies any SI, HI or audiovisual hallucinations, she is here voluntarily seeking help. We'll obtain screening psychiatric labs, consults TTS.  ----------------------------------------- 6:54 AM on 10/06/2016 -----------------------------------------  EKG reassuring on arrival, not consistent with acute ischemia. First troponin negative, second troponin pending, if remains negative, I doubt that her chest pain represents ACS. I suspect her pain is likely musculoskeletal in nature and secondary to rib strain given that she has been coughing so much. I doubt her chest pain represents PE or acute aortic dissection though if her tachycardia is persistent, a d-dimer could be obtained. I do, however, think her tachycardia is more likely to be related to her cocaine use. EKG showed no heart strain pattern. CBC is generally unremarkable. CMP with mild hypokalemia, potassium 3.1. Undetectable ethanol, salicylate and acetaminophen levels. Urine drug screen positive for benzos and cocaine. HIV was negative. Chest x-ray clear, her upper respiratory infection symptoms are likely viral. Care transferred to Dr. Cyril Loosen at this time.  Clinical Course      ____________________________________________   FINAL CLINICAL IMPRESSION(S) / ED DIAGNOSES  Final diagnoses:  Polysubstance abuse  Upper respiratory tract infection, unspecified type      NEW MEDICATIONS STARTED DURING THIS VISIT:  New Prescriptions   No medications on file     Note:  This document was prepared using Dragon voice recognition software and may include unintentional dictation errors.    Gayla Doss, MD 10/06/16 [phone]

## 2016-10-06 NOTE — BH Assessment (Signed)
Assessment Note  Shannon Campbell is an 40 y.o. female presenting to the ED with concerns of alcohol and crack cocaine use.  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  She says that she came to the ED, primarily to make sure "she's ok and that she gets the alcohol out of her system".  Patient also has concerns of thrush and congestion.  Patient states she is not interested in detox at this time.  She reports she has a good support system and has a sponsor thru [employer].  She also reports she receives outpatient mental health services in [place] with Dr. Fannie Knee.  Diagnosis: Drug/alcohol use  Past Medical History:  Past Medical History:  Diagnosis Date  . Anxiety   . Asthma   . Depression   . Fibromyalgia   . History of cardiac arrest   . Pap smear abnormality of cervix with LGSIL   . VAIN (vaginal intraepithelial neoplasia)     Past Surgical History:  Procedure Laterality Date  . ABDOMINAL HYSTERECTOMY  2013  . CESAREAN SECTION  1999  . HERNIA REPAIR  2002  . NASAL SINUS SURGERY    . WRIST SURGERY      Family History:  Family History  Problem Relation Age of Onset  . Thyroid disease Mother   . Hypertension Father   . Hemochromatosis Father     Social History:  reports that she has been smoking Cigarettes.  She has a 15.00 pack-year smoking history. She has never used smokeless tobacco. She reports that she drinks alcohol. She reports that she uses drugs, including "Crack" cocaine and Heroin.  Additional Social History:  Alcohol / Drug Use History of alcohol / drug use?: Yes Substance #1 Name of Substance 1: alcohol 1 - Age of First Use: can't remember 1 - Amount (size/oz): varies 1 - Frequency: daily 1 - Duration: varies 1 - Last Use / Amount: 09/30/16 Substance #2 Name of Substance 2: crack cocaine 2 - Age of First Use: can't remember 2 - Amount (size/oz): varies 2 - Frequency: varies 2 - Duration: varies 2 - Last Use / Amount:  10/06/16  CIWA: CIWA-Ar BP: 139/82 Pulse Rate: (!) 108 COWS:    Allergies:  Allergies  Allergen Reactions  . Prednisone     Other reaction(s): Other (See Comments) Makes her moody    Home Medications:  (Not in a hospital admission)  OB/GYN Status:  Patient's last menstrual period was 12/26/2011 (lmp unknown).  General Assessment Data Location of Assessment: [facility] ED TTS Assessment: In system Is this a Tele or Face-to-Face Assessment?: Face-to-Face Is this an Initial Assessment or a Re-assessment for this encounter?: Initial Assessment Marital status: Single Maiden name: n/a Is patient pregnant?: No Pregnancy Status: No Living Arrangements: Spouse/significant other Can pt return to current living arrangement?: Yes Admission Status: Voluntary Is patient capable of signing voluntary admission?: Yes Referral Source: Self/Family/Friend Insurance type: Medicaid  Medical Screening Exam [facility] Walk-in ONLY) Medical Exam completed: Yes  Crisis Care Plan Living Arrangements: Spouse/significant other Legal Guardian: Other: (self) Name of Psychiatrist: Dr. Fannie Knee Name of Therapist: Dr. Fannie Knee  Education Status Is patient currently in school?: No Current Grade: n/a Highest grade of school patient has completed: 12 Name of school: n/a Contact person: n/a  Risk to self with the past 6 months Suicidal Ideation: No Has patient been a risk to self within the past 6 months prior to admission? : No Suicidal Intent: No Has patient had any suicidal intent  within the past 6 months prior to admission? : No Is patient at risk for suicide?: No Suicidal Plan?: No Has patient had any suicidal plan within the past 6 months prior to admission? : No Access to Means: No What has been your use of drugs/alcohol within the last 12 months?: alcohol, crack cocaine Previous Attempts/Gestures: No How many times?: 0 Other Self Harm Risks: none identified Triggers for Past Attempts: None  known Intentional Self Injurious Behavior: None Family Suicide History: No Recent stressful life event(s): Other (Comment) (drug/alcohol relapse) Persecutory voices/beliefs?: No Depression: No Substance abuse history and/or treatment for substance abuse?: Yes Suicide prevention information given to non-admitted patients: Not applicable  Risk to Others within the past 6 months Homicidal Ideation: No Does patient have any lifetime risk of violence toward others beyond the six months prior to admission? : No Thoughts of Harm to Others: No Current Homicidal Intent: No Current Homicidal Plan: No Access to Homicidal Means: No Identified Victim: none identified History of harm to others?: No Assessment of Violence: None Noted Violent Behavior Description: none identified Does patient have access to weapons?: No Criminal Charges Pending?: No Does patient have a court date: No Is patient on probation?: No  Psychosis Hallucinations: None noted Delusions: None noted  Mental Status Report Appearance/Hygiene: In scrubs Eye Contact: Good Motor Activity: Tremors, Unsteady Speech: Logical/coherent Level of Consciousness: Alert Mood: Anxious Affect: Anxious, Appropriate to circumstance Anxiety Level: Minimal Thought Processes: Relevant Judgement: Partial Orientation: Person, Place, Time, Situation Obsessive Compulsive Thoughts/Behaviors: None  Cognitive Functioning Concentration: Normal Memory: Recent Intact, Remote Intact IQ: Average Insight: Fair Impulse Control: Fair Appetite: Fair Weight Loss: 0 Weight Gain: 0 Sleep: No Change Vegetative Symptoms: None  ADLScreening [facility] Assessment Services) Patient's cognitive ability adequate to safely complete daily activities?: Yes Patient able to express need for assistance with ADLs?: Yes Independently performs ADLs?: Yes (appropriate for developmental age)  Prior Inpatient Therapy Prior Inpatient Therapy: No Prior Therapy  Dates: n/a Prior Therapy Facilty/Provider(s): n/a Reason for Treatment: n/a  Prior Outpatient Therapy Prior Outpatient Therapy: Yes Prior Therapy Dates: current [facility] Reason for Treatment: anxiety Does patient have an ACCT team?: No Does patient have Intensive In-House Services?  : No Does patient have [facility] services? : No Does patient have P4CC services?: No  ADL Screening (condition at time of admission) Patient's cognitive ability adequate to safely complete daily activities?: Yes Patient able to express need for assistance with ADLs?: Yes Independently performs ADLs?: Yes (appropriate for developmental age)       Abuse/Neglect Assessment (Assessment to be complete while patient is alone) Physical Abuse: Denies Verbal Abuse: Denies Sexual Abuse: Denies Exploitation of patient/patient's resources: Denies Self-Neglect: Denies Values / Beliefs Cultural Requests During Hospitalization: None Spiritual Requests During Hospitalization: None Consults Spiritual Care Consult Needed: No Social Work Consult Needed: No      Additional Information 1:1 In Past 12 Months?: No CIRT Risk: No Elopement Risk: No Does patient have medical clearance?: Yes     Disposition:  Disposition Initial Assessment Completed for this Encounter: Yes Disposition of Patient: Outpatient treatment Type of outpatient treatment: Adult (Follow up with outpatient provider)  On Site Evaluation by:   Reviewed with Physician:    Artist Beachoxana C Ennis Delpozo 10/06/2016 4:56 AM

## 2016-10-06 NOTE — ED Triage Notes (Addendum)
Pt states she had been drug free for 4 years and Saturday she relapsed and has been using crack and alcohol since. Also co hoarseness, cough, and congestion. Denies any SI or HI at this time.;

## 2016-10-14 DIAGNOSIS — F1020 Alcohol dependence, uncomplicated: Secondary | ICD-10-CM | POA: Insufficient documentation

## 2016-10-18 ENCOUNTER — Ambulatory Visit: Payer: Medicare Other | Admitting: Obstetrics and Gynecology

## 2016-10-19 ENCOUNTER — Ambulatory Visit
Admission: EM | Admit: 2016-10-19 | Discharge: 2016-10-19 | Disposition: A | Discharge status: Home / Self Care | Payer: No Typology Code available for payment source | Attending: Emergency Medicine | Admitting: Emergency Medicine

## 2016-10-19 ENCOUNTER — Emergency Department
Admission: EM | Admit: 2016-10-19 | Discharge: 2016-10-20 | Disposition: A | Discharge status: Home / Self Care | Payer: Medicare Other | Attending: Emergency Medicine | Admitting: Emergency Medicine

## 2016-10-19 ENCOUNTER — Encounter: Payer: Self-pay | Admitting: Emergency Medicine

## 2016-10-19 DIAGNOSIS — F17210 Nicotine dependence, cigarettes, uncomplicated: Secondary | ICD-10-CM | POA: Diagnosis not present

## 2016-10-19 DIAGNOSIS — J45909 Unspecified asthma, uncomplicated: Secondary | ICD-10-CM | POA: Diagnosis not present

## 2016-10-19 DIAGNOSIS — T7621XA Adult sexual abuse, suspected, initial encounter: Secondary | ICD-10-CM | POA: Insufficient documentation

## 2016-10-19 DIAGNOSIS — Z0441 Encounter for examination and observation following alleged adult rape: Secondary | ICD-10-CM | POA: Insufficient documentation

## 2016-10-19 DIAGNOSIS — T7421XA Adult sexual abuse, confirmed, initial encounter: Secondary | ICD-10-CM
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
## 2016-10-20 DIAGNOSIS — T7621XA Adult sexual abuse, suspected, initial encounter: Secondary | ICD-10-CM | POA: Diagnosis not present

## 2016-10-20 MED ORDER — AZITHROMYCIN 500 MG PO TABS
ORAL_TABLET | ORAL | Status: DC
Start: 2016-10-20 — End: 2016-10-20
  Filled 2016-10-20: qty 2

## 2016-10-20 MED ORDER — CEFTRIAXONE SODIUM 250 MG IJ SOLR
250.0000 mg | Freq: Once | INTRAMUSCULAR | Status: AC
  Administered 2016-10-20: 250 mg via INTRAMUSCULAR

## 2016-10-20 MED ORDER — LIDOCAINE HCL (PF) 1 % IJ SOLN
INTRAMUSCULAR | Status: AC
  Filled 2016-10-20: qty 5

## 2016-10-20 MED ORDER — CEFTRIAXONE SODIUM 250 MG IJ SOLR
INTRAMUSCULAR | Status: AC
  Filled 2016-10-20: qty 250

## 2016-10-20 MED ORDER — AZITHROMYCIN 500 MG PO TABS
1000.0000 mg | ORAL_TABLET | Freq: Once | ORAL | Status: AC
  Administered 2016-10-20: 1000 mg via ORAL

## 2016-10-20 MED ORDER — METRONIDAZOLE 500 MG PO TABS
2000.0000 mg | ORAL_TABLET | Freq: Once | ORAL | Status: AC
  Administered 2016-10-20: 2000 mg via ORAL

## 2016-10-20 MED ORDER — LIDOCAINE HCL (PF) 1 % IJ SOLN
0.9000 mL | Freq: Once | INTRAMUSCULAR | Status: AC
  Administered 2016-10-20: 0.9 mL
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
## 2016-10-31 ENCOUNTER — Ambulatory Visit (INDEPENDENT_AMBULATORY_CARE_PROVIDER_SITE_OTHER): Payer: Medicare Other

## 2016-10-31 DIAGNOSIS — R1032 Left lower quadrant pain: Secondary | ICD-10-CM
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
## 2016-12-10 ENCOUNTER — Emergency Department
Admission: EM | Admit: 2016-12-10 | Discharge: 2016-12-10 | Disposition: A | Discharge status: Home / Self Care | Payer: Medicare Other | Source: Home / Self Care | Attending: Emergency Medicine | Admitting: Emergency Medicine

## 2016-12-10 ENCOUNTER — Emergency Department: Payer: Medicare Other

## 2016-12-10 ENCOUNTER — Encounter: Payer: Self-pay | Admitting: Emergency Medicine

## 2016-12-10 DIAGNOSIS — Z79899 Other long term (current) drug therapy: Secondary | ICD-10-CM

## 2016-12-10 DIAGNOSIS — Z888 Allergy status to other drugs, medicaments and biological substances status: Secondary | ICD-10-CM

## 2016-12-10 DIAGNOSIS — F17210 Nicotine dependence, cigarettes, uncomplicated: Secondary | ICD-10-CM | POA: Diagnosis present

## 2016-12-10 DIAGNOSIS — J45909 Unspecified asthma, uncomplicated: Secondary | ICD-10-CM

## 2016-12-10 DIAGNOSIS — K810 Acute cholecystitis: Secondary | ICD-10-CM | POA: Diagnosis not present

## 2016-12-10 DIAGNOSIS — Z8674 Personal history of sudden cardiac arrest: Secondary | ICD-10-CM

## 2016-12-10 DIAGNOSIS — F329 Major depressive disorder, single episode, unspecified: Secondary | ICD-10-CM | POA: Diagnosis present

## 2016-12-10 DIAGNOSIS — M797 Fibromyalgia: Secondary | ICD-10-CM | POA: Diagnosis present

## 2016-12-10 DIAGNOSIS — K829 Disease of gallbladder, unspecified: Secondary | ICD-10-CM

## 2016-12-10 DIAGNOSIS — F419 Anxiety disorder, unspecified: Secondary | ICD-10-CM | POA: Diagnosis not present

## 2016-12-10 DIAGNOSIS — R1011 Right upper quadrant pain: Secondary | ICD-10-CM

## 2016-12-10 HISTORY — DX: Other psychoactive substance abuse, uncomplicated: F19.10

## 2016-12-10 LAB — URINALYSIS, COMPLETE (UACMP) WITH MICROSCOPIC
BILIRUBIN URINE: NEGATIVE
Glucose, UA: NEGATIVE mg/dL
HGB URINE DIPSTICK: NEGATIVE
KETONES UR: NEGATIVE mg/dL
LEUKOCYTES UA: NEGATIVE
Nitrite: NEGATIVE
PH: 5 (ref 5.0–8.0)
Protein, ur: NEGATIVE mg/dL
Specific Gravity, Urine: 1.027 (ref 1.005–1.030)

## 2016-12-10 LAB — CBC WITH DIFFERENTIAL/PLATELET
BASOS ABS: 0 10*3/uL (ref 0–0.1)
BASOS PCT: 1 %
EOS ABS: 0 10*3/uL (ref 0–0.7)
EOS PCT: 1 %
HCT: 42.8 % (ref 35.0–47.0)
Hemoglobin: 15 g/dL (ref 12.0–16.0)
LYMPHS PCT: 27 %
Lymphs Abs: 1.4 10*3/uL (ref 1.0–3.6)
MCH: 32.2 pg (ref 26.0–34.0)
MCHC: 35.1 g/dL (ref 32.0–36.0)
MCV: 91.6 fL (ref 80.0–100.0)
Monocytes Absolute: 0.3 10*3/uL (ref 0.2–0.9)
Monocytes Relative: 6 %
Neutro Abs: 3.5 10*3/uL (ref 1.4–6.5)
Neutrophils Relative %: 65 %
PLATELETS: 257 10*3/uL (ref 150–440)
RBC: 4.67 MIL/uL (ref 3.80–5.20)
RDW: 13.1 % (ref 11.5–14.5)
WBC: 5.3 10*3/uL (ref 3.6–11.0)

## 2016-12-10 LAB — COMPREHENSIVE METABOLIC PANEL
ALBUMIN: 4.2 g/dL (ref 3.5–5.0)
ALT: 24 U/L (ref 14–54)
AST: 23 U/L (ref 15–41)
Alkaline Phosphatase: 80 U/L (ref 38–126)
Anion gap: 6 (ref 5–15)
BUN: 17 mg/dL (ref 6–20)
CHLORIDE: 110 mmol/L (ref 101–111)
CO2: 21 mmol/L — AB (ref 22–32)
CREATININE: 0.87 mg/dL (ref 0.44–1.00)
Calcium: 9 mg/dL (ref 8.9–10.3)
GFR calc Af Amer: >60 mL/min (ref >60)
GFR calc non Af Amer: >60 mL/min (ref >60)
Glucose, Bld: 104 mg/dL — ABNORMAL HIGH (ref 65–99)
POTASSIUM: 4 mmol/L (ref 3.5–5.1)
SODIUM: 137 mmol/L (ref 135–145)
Total Bilirubin: 0.7 mg/dL (ref 0.3–1.2)
Total Protein: 7.6 g/dL (ref 6.5–8.1)

## 2016-12-10 LAB — LIPASE, BLOOD: Lipase: 23 U/L (ref 11–51)

## 2016-12-10 MED ORDER — ONDANSETRON HCL 4 MG PO TABS: 4.0000 mg | ORAL_TABLET | Freq: Three times a day (TID) | ORAL | 0 refills | Status: DC | PRN

## 2016-12-10 MED ORDER — TRAMADOL HCL 50 MG PO TABS
50.0000 mg | ORAL_TABLET | Freq: Once | ORAL | Status: AC
  Administered 2016-12-10: 50 mg via ORAL
  Filled 2016-12-10: qty 1

## 2016-12-10 MED ORDER — CEPHALEXIN 500 MG PO CAPS: 500.0000 mg | ORAL_CAPSULE | Freq: Two times a day (BID) | ORAL | 0 refills | Status: AC

## 2016-12-10 NOTE — ED Triage Notes (Signed)
Progressing Right upper quadrant pain that radiates around to right middle back for past couple of weeks. C/o nausea. Denies vomiting, diarrhea or fever.

## 2016-12-10 NOTE — ED Notes (Signed)
E-signature pad not working, pt verbalized understanding or written and verbal discharge instructions.

## 2016-12-10 NOTE — Discharge Instructions (Signed)
Please return immediately if condition worsens. Please contact her primary physician or the physician you were given for referral. If you have any specialist physicians involved in her treatment and plan please also contact them. Thank you for using [facility]. ° °

## 2016-12-10 NOTE — ED Provider Notes (Signed)
Time Seen: Approximately 1011  I have reviewed the triage notes  Chief Complaint: Abdominal Pain   History of Present Illness: Shannon Campbell is a 40 y.o. female who presents with intermittent right upper quadrant abdominal pain that seems to be worse in the morning. She states the pain is decreased from what she woke up with this morning. States the episodes seem to be coming more frequently. She did go to a medical bed in outpatient clinic and was recommended to have an abdominal ultrasound at that time but states she felt better so she did not proceed with the ultrasound. She denies any fever. She denies any melena or hematochezia diarrhea constipation dysuria hematuria etc. 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   Past Medical History:  Diagnosis Date  . Anxiety   . Asthma   . Depression   . Fibromyalgia   . History of cardiac arrest   . Pap smear abnormality of cervix with LGSIL   . Substance abuse   . VAIN (vaginal intraepithelial neoplasia)     Patient Active Problem List   Diagnosis Date Noted  . Chest pain 08/07/2016  . PMS (premenstrual syndrome) 07/09/2015  . Tobacco abuse 07/09/2015  . Asthma without status asthmaticus 07/08/2015  . Chicken pox 07/08/2015  . Clinical depression 07/08/2015  . 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 Inflamed nasal mucosa 07/08/2015  . Avitaminosis D 07/08/2015    Past Surgical History:  Procedure Laterality Date  . ABDOMINAL HYSTERECTOMY  2013  . CESAREAN SECTION  1999  . HERNIA REPAIR  2002  . NASAL SINUS SURGERY    . WRIST SURGERY      Past Surgical History:  Procedure Laterality Date  . ABDOMINAL HYSTERECTOMY  2013  . CESAREAN SECTION  1999  . HERNIA REPAIR  2002  . NASAL SINUS SURGERY    . WRIST SURGERY      Current Outpatient Rx  . Order #: [id number] Class: Historical Med  . Order #: [id number] Class: Historical Med  . Order #:  [id number] Class: Historical Med  . Order #: [id number] Class: Historical Med  . Order #: [id number] Class: Print  . Order #: [id number] Class: Historical Med  . Order #: [id number] Class: Historical Med  . Order #: [id number] Class: Historical Med  . Order #: [id number] Class: Historical Med  . Order #: [id number] Class: Print    Allergies:  Prednisone  Family History: Family History  Problem Relation Age of Onset  . Thyroid disease Mother   . Hypertension Father   . Hemochromatosis Father     Social History: Social History  Substance Use Topics  . Smoking status: Current Every Day Smoker    Packs/day: 1.00    Years: 15.00    Types: Cigarettes  . Smokeless tobacco: Never Used  . Alcohol use No     Comment: in recovery for 3.5 years; last used ETOH 1 month ago     Review of Systems:   10 point review of systems was performed and was otherwise negative:  Constitutional: No fever Eyes: No visual disturbances ENT: No sore throat, ear pain Cardiac: No chest pain Respiratory: No shortness of breath, wheezing, or stridor Abdomen: Patient points mainly to the right upper quadrant without any lower abdominal pain no left-sided or left-sided flank discomfort. No obvious relation to meals so seems to be worse in the morning associated with nausea Endocrine: No weight loss, No night sweats Extremities: No peripheral edema, cyanosis Skin: No  rashes, easy bruising Neurologic: No focal weakness, trouble with speech or swollowing Urologic: No dysuria, Hematuria, or urinary frequency   Physical Exam:  ED Triage Vitals  Enc Vitals Group     BP 12/10/16 0951 130/84     Pulse Rate 12/10/16 0951 99     Resp 12/10/16 0951 18     Temp 12/10/16 0951 98.2 F (36.8 C)     Temp Source 12/10/16 0951 Oral     SpO2 12/10/16 0951 99 %     Weight 12/10/16 0952 170 lb (77.1 kg)     Height 12/10/16 0952 5\' 3"  (1.6 m)     Head Circumference --      Peak Flow --      Pain Score 12/10/16 1004 7      Pain Loc --      Pain Edu? --      Excl. in GC? --     General: Awake , Alert , and Oriented times 3; GCS 15 Head: Normal cephalic , atraumatic Eyes: Pupils equal , round, reactive to light Nose/Throat: No nasal drainage, patent upper airway without erythema or exudate.  Neck: Supple, Full range of motion, No anterior adenopathy or palpable thyroid masses Lungs: Clear to ascultation without wheezes , rhonchi, or rales Heart: Regular rate, regular rhythm without murmurs , gallops , or rubs Abdomen: Patient's tenderness over the right upper quadrant. Negative Murphy's sign. No rebound guarding or rigidity. No focal tenderness over McBurney's point. Bowel sounds are positive and symmetric in all 4 quadrants without any organosplenomegaly      Extremities: 2 plus symmetric pulses. No edema, clubbing or cyanosis Neurologic: normal ambulation, Motor symmetric without deficits, sensory intact Skin: warm, dry, no rashes   Labs:   All laboratory work was reviewed including any pertinent negatives or positives listed below:  Labs Reviewed  CBC WITH DIFFERENTIAL/PLATELET  COMPREHENSIVE METABOLIC PANEL  LIPASE, BLOOD  URINALYSIS, COMPLETE (UACMP) WITH MICROSCOPIC  Review of the patient's laboratory work shows what appears to be a urinary tract infection and urine culture was added.  Radiology:  "[place] Abdomen Limited Ruq  Result Date: 12/10/2016 CLINICAL DATA:  Right upper quadrant pain EXAM: US ABDOMEN LIMITED - RIGHT UPPER QUADRANT COMPARISON:  CT 07/13/2006 FINDINGS: Gallbladder: Single mobile gallstone, measuring 11 mm. There is focal gallbladder wall thickening, measuring up to 6 mm. Negative sonographic Murphy's. Common bile duct: Diameter: Normal caliber, 4 mm Liver: Increased echotexture throughout the liver suggesting fatty infiltration. No focal abnormality or biliary ductal dilatation. IMPRESSION: Cholelithiasis. Focal gallbladder wall thickening may be related to changes of chronic  cholecystitis. No definite sonographic changes of acute cholecystitis. Fatty infiltration of the liver. Electronically Signed   By: Charlett NoseKevin  Dover M.D.   On: 12/10/2016 11:16  "  I personally reviewed the radiologic studies    ED Course:  Given the patient's current presentation and objective findings I felt most likely this was cholelithiasis with some indications on ultrasound a possible chronic cholecystitis. Clinically her pain seems to be anteriorly and not so much in the flank area where it radiates. Patient's had pain control here with Ultram and has been afebrile with a normal white blood cell count and I felt that immediate surgical consultation was not necessary. I felt this was unlikely to be pyelonephritis at this time however since we found laboratory work indicating urinary tract infection I felt she would benefit with oral antibiotic therapy. Patient was advised to return here if she develops a fever, persistent vomiting  or any other new concerns. Clinical Course      Assessment:  Acute exacerbation of chronic right upper quadrant abdominal pain Cholelithiasis* Urinary tract infection   Final Clinical Impression:   Final diagnoses:  Right upper quadrant abdominal pain     Plan:  Outpatient " New Prescriptions   CEPHALEXIN (KEFLEX) 500 MG CAPSULE    Take 1 capsule (500 mg total) by mouth 2 (two) times daily.   ONDANSETRON (ZOFRAN) 4 MG TABLET    Take 1 tablet (4 mg total) by mouth every 8 (eight) hours as needed for nausea or vomiting.  " Patient was advised to return immediately if condition worsens. Patient was advised to follow up with their primary care physician or other specialized physicians involved in their outpatient care. The patient and/or family member/power of attorney had laboratory results reviewed at the bedside. All questions and concerns were addressed and appropriate discharge instructions were distributed by the nursing staff.              Jennye Moccasin, MD 12/10/16 [phone]

## 2016-12-11 ENCOUNTER — Inpatient Hospital Stay
Admission: AD | Admit: 2016-12-11 | Discharge: 2016-12-13 | DRG: 419 | Disposition: A | Discharge status: Home / Self Care | Payer: Medicare Other | Source: Ambulatory Visit | Attending: Surgery | Admitting: Surgery

## 2016-12-11 ENCOUNTER — Encounter: Payer: Self-pay | Admitting: Surgery

## 2016-12-11 ENCOUNTER — Ambulatory Visit (INDEPENDENT_AMBULATORY_CARE_PROVIDER_SITE_OTHER): Payer: Medicare Other | Admitting: Surgery

## 2016-12-11 VITALS — BP 123/85 | HR 86 | Temp 98.0°F | Ht 63.0 in | Wt 174.0 lb

## 2016-12-11 DIAGNOSIS — J45909 Unspecified asthma, uncomplicated: Secondary | ICD-10-CM | POA: Diagnosis not present

## 2016-12-11 DIAGNOSIS — K810 Acute cholecystitis: Secondary | ICD-10-CM | POA: Diagnosis present

## 2016-12-11 DIAGNOSIS — Z8674 Personal history of sudden cardiac arrest: Secondary | ICD-10-CM | POA: Diagnosis not present

## 2016-12-11 DIAGNOSIS — K8000 Calculus of gallbladder with acute cholecystitis without obstruction: Secondary | ICD-10-CM | POA: Diagnosis not present

## 2016-12-11 DIAGNOSIS — F419 Anxiety disorder, unspecified: Secondary | ICD-10-CM | POA: Diagnosis not present

## 2016-12-11 DIAGNOSIS — Z888 Allergy status to other drugs, medicaments and biological substances status: Secondary | ICD-10-CM | POA: Diagnosis not present

## 2016-12-11 DIAGNOSIS — F17210 Nicotine dependence, cigarettes, uncomplicated: Secondary | ICD-10-CM | POA: Diagnosis not present

## 2016-12-11 DIAGNOSIS — F329 Major depressive disorder, single episode, unspecified: Secondary | ICD-10-CM | POA: Diagnosis not present

## 2016-12-11 DIAGNOSIS — M797 Fibromyalgia: Secondary | ICD-10-CM | POA: Diagnosis not present

## 2016-12-11 HISTORY — DX: Calculus of gallbladder with acute cholecystitis without obstruction: K80.00

## 2016-12-11 HISTORY — DX: Acute cholecystitis: K81.0

## 2016-12-11 LAB — URINE DRUG SCREEN, QUALITATIVE (ARMC ONLY)
AMPHETAMINES, UR SCREEN: NOT DETECTED
BENZODIAZEPINE, UR SCRN: NOT DETECTED
Barbiturates, Ur Screen: NOT DETECTED
COCAINE METABOLITE, UR ~~LOC~~: NOT DETECTED
Cannabinoid 50 Ng, Ur ~~LOC~~: NOT DETECTED
MDMA (Ecstasy)Ur Screen: NOT DETECTED
METHADONE SCREEN, URINE: NOT DETECTED
OPIATE, UR SCREEN: NOT DETECTED
Phencyclidine (PCP) Ur S: NOT DETECTED
Tricyclic, Ur Screen: NOT DETECTED

## 2016-12-11 LAB — COMPREHENSIVE METABOLIC PANEL
ALBUMIN: 3.9 g/dL (ref 3.5–5.0)
ALT: 23 U/L (ref 14–54)
ANION GAP: 6 (ref 5–15)
AST: 18 U/L (ref 15–41)
Alkaline Phosphatase: 66 U/L (ref 38–126)
BUN: 12 mg/dL (ref 6–20)
CO2: 24 mmol/L (ref 22–32)
Calcium: 8.8 mg/dL — ABNORMAL LOW (ref 8.9–10.3)
Chloride: 106 mmol/L (ref 101–111)
Creatinine, Ser: 0.84 mg/dL (ref 0.44–1.00)
GFR calc Af Amer: >60 mL/min (ref >60)
GFR calc non Af Amer: >60 mL/min (ref >60)
GLUCOSE: 88 mg/dL (ref 65–99)
POTASSIUM: 3.5 mmol/L (ref 3.5–5.1)
SODIUM: 136 mmol/L (ref 135–145)
Total Bilirubin: 0.4 mg/dL (ref 0.3–1.2)
Total Protein: 7.1 g/dL (ref 6.5–8.1)

## 2016-12-11 LAB — URINALYSIS, ROUTINE W REFLEX MICROSCOPIC
Bilirubin Urine: NEGATIVE
GLUCOSE, UA: NEGATIVE mg/dL
Hgb urine dipstick: NEGATIVE
Ketones, ur: NEGATIVE mg/dL
LEUKOCYTES UA: NEGATIVE
Nitrite: NEGATIVE
PH: 5 (ref 5.0–8.0)
PROTEIN: NEGATIVE mg/dL
Specific Gravity, Urine: 1.012 (ref 1.005–1.030)

## 2016-12-11 LAB — CBC WITH DIFFERENTIAL/PLATELET
BASOS PCT: 1 %
Basophils Absolute: 0.1 10*3/uL (ref 0–0.1)
EOS ABS: 0 10*3/uL (ref 0–0.7)
Eosinophils Relative: 1 %
HCT: 40.6 % (ref 35.0–47.0)
Hemoglobin: 14.2 g/dL (ref 12.0–16.0)
Lymphocytes Relative: 31 %
Lymphs Abs: 2.2 10*3/uL (ref 1.0–3.6)
MCH: 32.2 pg (ref 26.0–34.0)
MCHC: 35 g/dL (ref 32.0–36.0)
MCV: 92 fL (ref 80.0–100.0)
MONO ABS: 0.4 10*3/uL (ref 0.2–0.9)
MONOS PCT: 6 %
NEUTROS PCT: 61 %
Neutro Abs: 4.3 10*3/uL (ref 1.4–6.5)
Platelets: 247 10*3/uL (ref 150–440)
RBC: 4.41 MIL/uL (ref 3.80–5.20)
RDW: 13.2 % (ref 11.5–14.5)
WBC: 7 10*3/uL (ref 3.6–11.0)

## 2016-12-11 LAB — MRSA PCR SCREENING: MRSA by PCR: NEGATIVE

## 2016-12-11 MED ORDER — LACTATED RINGERS IV SOLN
125.0000 mL/h | INTRAVENOUS | Status: DC
  Administered 2016-12-11 – 2016-12-12 (×3): 125 mL/h via INTRAVENOUS

## 2016-12-11 MED ORDER — ONDANSETRON HCL 4 MG/2ML IJ SOLN: 4.0000 mg | Freq: Four times a day (QID) | INTRAMUSCULAR | Status: DC | PRN

## 2016-12-11 MED ORDER — ONDANSETRON 4 MG PO TBDP: 4.0000 mg | ORAL_TABLET | Freq: Four times a day (QID) | ORAL | Status: DC | PRN

## 2016-12-11 MED ORDER — ACETAMINOPHEN 500 MG PO TABS
1000.0000 mg | ORAL_TABLET | Freq: Four times a day (QID) | ORAL | Status: DC
  Administered 2016-12-11 – 2016-12-12 (×3): 1000 mg via ORAL
  Filled 2016-12-11 (×5): qty 2

## 2016-12-11 MED ORDER — HEPARIN SODIUM (PORCINE) 5000 UNIT/ML IJ SOLN
5000.0000 [IU] | Freq: Three times a day (TID) | INTRAMUSCULAR | Status: DC
  Administered 2016-12-11 – 2016-12-12 (×4): 5000 [IU] via SUBCUTANEOUS
  Filled 2016-12-11 (×4): qty 1

## 2016-12-11 MED ORDER — CEFOXITIN SODIUM IN DEXTROSE 2-1.1 GM/50ML IV SOLN
2.0000 g | Freq: Three times a day (TID) | INTRAVENOUS | Status: DC
  Filled 2016-12-11: qty 50

## 2016-12-11 MED ORDER — OXYCODONE HCL 5 MG PO TABS
5.0000 mg | ORAL_TABLET | ORAL | Status: DC | PRN
  Administered 2016-12-11 (×2): 5 mg via ORAL
  Administered 2016-12-12: 10 mg via ORAL
  Filled 2016-12-11: qty 1
  Filled 2016-12-11: qty 2
  Filled 2016-12-11: qty 1

## 2016-12-11 MED ORDER — NICOTINE 21 MG/24HR TD PT24
21.0000 mg | MEDICATED_PATCH | Freq: Every day | TRANSDERMAL | Status: DC
  Administered 2016-12-11 – 2016-12-13 (×3): 21 mg via TRANSDERMAL
  Filled 2016-12-11 (×3): qty 1

## 2016-12-11 MED ORDER — HYDROMORPHONE HCL 1 MG/ML IJ SOLN
0.5000 mg | INTRAMUSCULAR | Status: DC | PRN
  Administered 2016-12-12 (×3): 0.5 mg via INTRAVENOUS
  Filled 2016-12-11 (×3): qty 0.5

## 2016-12-11 MED ORDER — KETOROLAC TROMETHAMINE 30 MG/ML IJ SOLN
30.0000 mg | Freq: Four times a day (QID) | INTRAMUSCULAR | Status: DC
  Administered 2016-12-11 – 2016-12-12 (×6): 30 mg via INTRAVENOUS
  Filled 2016-12-11 (×6): qty 1

## 2016-12-11 MED ORDER — DEXTROSE 5 % IV SOLN
2.0000 g | Freq: Three times a day (TID) | INTRAVENOUS | Status: DC
  Filled 2016-12-11 (×4): qty 2

## 2016-12-11 MED ORDER — POLYETHYLENE GLYCOL 3350 17 G PO PACK: 17.0000 g | PACK | Freq: Every day | ORAL | Status: DC | PRN

## 2016-12-11 MED ORDER — PANTOPRAZOLE SODIUM 40 MG IV SOLR
40.0000 mg | Freq: Every day | INTRAVENOUS | Status: DC
  Administered 2016-12-11 – 2016-12-12 (×2): 40 mg via INTRAVENOUS
  Filled 2016-12-11 (×2): qty 40

## 2016-12-11 MED ORDER — DEXTROSE 5 % IV SOLN
2.0000 g | Freq: Three times a day (TID) | INTRAVENOUS | Status: DC
  Administered 2016-12-11 – 2016-12-12 (×3): 2 g via INTRAVENOUS
  Filled 2016-12-11 (×6): qty 2

## 2016-12-11 NOTE — Progress Notes (Signed)
12/11/2016  Reason for Visit:  Abdominal pain, nausea, vomiting  History of Present Illness: Shannon Campbell is a 40 y.o. female who presents with a one-day history of persistent abdominal pain in the right upper quadrant with multiple episodes of nausea and vomiting today.  The patient was seen in emergency room yesterday due to biliary colic episodes that had been ongoing over the past month. She reports that her pain is worse in the morning but then also comes and goes during the day and is particularly associated with by mouth intake. Her pain episodes at the worst would last about 30 minutes. The pain would be in the right upper quadrant with some radiation towards the right back. She has had nausea and dry heaves in the past but no actual episodes of vomiting until today. She denies having any fevers but reports having chills and cold sweats today. Denies any chest pain or shortness of breath. Denies any other areas of abdominal pain. Denies any constipation or diarrhea, dysuria or hematuria, or blood in the stools.  In the emergency room she had an ultrasound done showing a mobile gallstone measuring 11 mm with focal gallbladder wall thickening measuring up to 6 mm. She had normal WBC of 5.3 and normal LFTs as well. There was concern for possible UTI and was given a prescription for Keflex, the patient reports that she has not been able to take it today due to her nausea.   Past Medical History: Past Medical History:  Diagnosis Date  . Anxiety   . Asthma   . Depression   . Fibromyalgia   . History of cardiac arrest   . Pap smear abnormality of cervix with LGSIL   . Substance abuse   . VAIN (vaginal intraepithelial neoplasia)      Past Surgical History: Past Surgical History:  Procedure Laterality Date  . ABDOMINAL HYSTERECTOMY  2013  . CESAREAN SECTION  1999  . HERNIA REPAIR  2002  . NASAL SINUS SURGERY    . WRIST SURGERY      Home Medications: Prior to Admission  medications   Medication Sig Start Date End Date Taking? Authorizing Provider  buPROPion (WELLBUTRIN SR) 200 MG 12 hr tablet Take 200 mg by mouth daily.   Yes Historical Provider, MD  FLUoxetine (PROZAC) 20 MG capsule Take 20 mg by mouth daily.    Yes Historical Provider, MD  budesonide-formoterol (SYMBICORT) 80-4.5 MCG/ACT inhaler Inhale 2 puffs into the lungs 2 (two) times daily.    Historical Provider, MD  cephALEXin (KEFLEX) 500 MG capsule Take 1 capsule (500 mg total) by mouth 2 (two) times daily. Patient not taking: Reported on 12/11/2016 12/10/16 12/17/16  Jennye MoccasinBrian S Quigley, MD  clonazePAM (KLONOPIN) 1 MG tablet Take 1 mg by mouth at bedtime.     Historical Provider, MD  HYDROmorphone (DILAUDID) 2 MG tablet Take 1 tablet (2 mg total) by mouth every 6 (six) hours as needed for severe pain. Patient not taking: Reported on 12/11/2016 09/19/16   Hildred LaserAnika Cherry, MD  Iloperidone (FANAPT) 1 MG TABS Take 1 tablet by mouth 2 (two) times daily.    Historical Provider, MD  lamoTRIgine (LAMICTAL) 100 MG tablet Take 100 mg by mouth daily.    Historical Provider, MD  lurasidone (LATUDA) 20 MG TABS tablet Take 20 mg by mouth daily.    Historical Provider, MD  methylphenidate (METADATE CD) 20 MG CR capsule Take 20 mg by mouth 2 (two) times daily.    Historical Provider, MD  nystatin (MYCOSTATIN) 100000 UNIT/ML suspension Take 5 mLs (500,000 Units total) by mouth 4 (four) times daily. Patient not taking: Reported on 12/11/2016 10/06/16   Jene Everyobert Kinner, MD  ondansetron (ZOFRAN) 4 MG tablet Take 1 tablet (4 mg total) by mouth every 8 (eight) hours as needed for nausea or vomiting. Patient not taking: Reported on 12/11/2016 12/10/16   Jennye MoccasinBrian S Quigley, MD    Allergies: Allergies  Allergen Reactions  . Prednisone     Other reaction(s): Other (See Comments) Makes her moody    Social History: She has a history of substance abuse including alcohol and cocaine. She had been alcohol-free for 4 years but relapsed  last month and has been alcohol-free for 1 month. Her last cocaine use was 1 month ago as well. She does smoke half a pack of cigarettes per day  Family History: Family History  Problem Relation Age of Onset  . Thyroid disease Mother   . Hypertension Father   . Hemochromatosis Father     Review of Systems: Review of Systems  Constitutional: Positive for chills. Negative for fever and weight loss.  HENT: Negative for hearing loss.   Eyes: Negative for blurred vision.  Respiratory: Negative for cough and shortness of breath.   Cardiovascular: Negative for chest pain and leg swelling.  Gastrointestinal: Positive for abdominal pain, nausea and vomiting. Negative for blood in stool, constipation, diarrhea and heartburn.  Genitourinary: Positive for dysuria. Negative for hematuria.  Musculoskeletal: Negative for myalgias.  Skin: Negative for rash.  Neurological: Negative for dizziness.  Psychiatric/Behavioral: Negative for depression.  All other systems reviewed and are negative.   Physical Exam BP 123/85   Pulse 86   Temp 98 F (36.7 C) (Oral)   Ht 5\' 3"  (1.6 m)   Wt 78.9 kg (174 lb)   LMP 12/26/2011 (LMP Unknown)   BMI 30.82 kg/m  CONSTITUTIONAL: No acute distress HEENT:  Normocephalic, atraumatic, extraocular motion intact. NECK: Trachea is midline, and there is no jugular venous distension.  RESPIRATORY:  Lungs are clear, and breath sounds are equal bilaterally. Normal respiratory effort without pathologic use of accessory muscles. CARDIOVASCULAR: Heart is regular without murmurs, gallops, or rubs. GI: The abdomen is soft, nondistended, with tenderness to palpation in the right upper quadrant with radiation to the right upper flank on palpation and a positive Murphy's sign.  GU: No CVA tenderness. MUSCULOSKELETAL:  Normal muscle strength and tone in all four extremities.  No peripheral edema or cyanosis. SKIN: Skin turgor is normal. There are no pathologic skin lesions.   NEUROLOGIC:  Motor and sensation is grossly normal.  Cranial nerves are grossly intact. PSYCH:  Alert and oriented to person, place and time. Affect is normal.  Laboratory Analysis: No results found for this or any previous visit (from the past 24 hour(s)).  Imaging: No results found.  Assessment and Plan: This is a 40 y.o. female who presents with likely acute cholecystitis. I have personally reviewed all of the patient's laboratory studies and imaging studies. She appears to have had biliary colic episodes over the past month and now worsening to acute cholecystitis given her persistent pain, positive Murphy sign, nausea and vomiting.   After discussing with the patient she will be admitted to the hospital for possible surgical intervention. She will be ordered IV fluids and IV antibiotics with appropriate nausea and pain control. We'll discuss further with Dr. Tonita CongWoodham for possible laparoscopic cholecystectomy this admission. The patient understands this plan and all of her questions have been  answered.   Howie Ill, MD [facility]

## 2016-12-11 NOTE — H&P (Signed)
Patient ID: Shannon Campbell, female   DOB: 11/30/1976, 40 y.o.   MRN: [id number]  HPI Shannon Jacksonlizabeth Osbourne is a 40 y.o. female was direct admitted from the surgery clinic earlier today. Patient reports a 1 day history of persistent right upper quadrant abdominal pain. She had multiple episodes of nausea and vomiting earlier today. Since being admitted from clinic she reports that her pain is mostly resolved. She is in the emergency room for this yesterday and reports multiple episodes over the last month. Her pain is mostly associated with oral intake and usually lasts for about 30 minutes. The pain is always in the right upper quadrant and radiates to her right back. She admits to nausea but no vomiting. She denies any chest pains, shortness breath, fevers, chills, diarrhea, constipation.  HPI  Past Medical History:  Diagnosis Date  . Anxiety   . Asthma   . Depression   . Fibromyalgia   . History of cardiac arrest   . Pap smear abnormality of cervix with LGSIL   . Substance abuse   . VAIN (vaginal intraepithelial neoplasia)     Past Surgical History:  Procedure Laterality Date  . ABDOMINAL HYSTERECTOMY  2013  . CESAREAN SECTION  1999  . HERNIA REPAIR  2002  . NASAL SINUS SURGERY    . WRIST SURGERY      Family History  Problem Relation Age of Onset  . Thyroid disease Mother   . Hypertension Father   . Hemochromatosis Father     Social History Social History  Substance Use Topics  . Smoking status: Current Every Day Smoker    Packs/day: 1.00    Years: 15.00    Types: Cigarettes    Start date: 10/11/2014  . Smokeless tobacco: Never Used     Comment: nicotine  . Alcohol use No     Comment: in recovery for 3.5 years; last used ETOH 1 month ago    Allergies  Allergen Reactions  . Prednisone     Other reaction(s): Other (See Comments) Makes her moody    Current Facility-Administered Medications  Medication Dose Route Frequency Provider Last Rate Last Dose  .  acetaminophen (TYLENOL) tablet 1,000 mg  1,000 mg Oral Q6H Jose Piscoya, MD      . cefOXitin (MEFOXIN) 2 g in dextrose 5 % 50 mL IVPB  2 g Intravenous Q8H Jose Piscoya, MD      . heparin injection 5,000 Units  5,000 Units Subcutaneous Q8H Jose Piscoya, MD      . HYDROmorphone (DILAUDID) injection 0.5 mg  0.5 mg Intravenous Q4H PRN Henrene DodgeJose Piscoya, MD      . ketorolac (TORADOL) 30 MG/ML injection 30 mg  30 mg Intravenous Q6H Jose Piscoya, MD      . lactated ringers infusion  125 mL/hr Intravenous Continuous Henrene DodgeJose Piscoya, MD      . nicotine (NICODERM CQ - dosed in mg/24 hours) patch 21 mg  21 mg Transdermal Daily Ricarda Frameharles Titiana Severa, MD      . ondansetron (ZOFRAN-ODT) disintegrating tablet 4 mg  4 mg Oral Q6H PRN Henrene DodgeJose Piscoya, MD       Or  . ondansetron (ZOFRAN) injection 4 mg  4 mg Intravenous Q6H PRN Henrene DodgeJose Piscoya, MD      . oxyCODONE (Oxy IR/ROXICODONE) immediate release tablet 5-10 mg  5-10 mg Oral Q4H PRN Henrene DodgeJose Piscoya, MD      . pantoprazole (PROTONIX) injection 40 mg  40 mg Intravenous QHS Henrene DodgeJose Piscoya, MD      .  polyethylene glycol (MIRALAX / GLYCOLAX) packet 17 g  17 g Oral Daily PRN Henrene DodgeJose Piscoya, MD         Review of Systems A Multi-point review of systems was asked and was negative except for the findings documented in the history of present illness  Physical Exam Blood pressure 128/76, pulse 90, temperature 97.8 F (36.6 C), temperature source Oral, resp. rate (!) 24, height 5\' 3"  (1.6 m), weight 79.3 kg (174 lb 14.4 oz), last menstrual period 12/26/2011, SpO2 100 %. CONSTITUTIONAL: No acute distress. EYES: Pupils are equal, round, and reactive to light, Sclera are non-icteric. EARS, NOSE, MOUTH AND THROAT: The oropharynx is clear. The oral mucosa is pink and moist. Hearing is intact to voice. LYMPH NODES:  Lymph nodes in the neck are normal. RESPIRATORY:  Lungs are clear. There is normal respiratory effort, with equal breath sounds bilaterally, and without pathologic use of accessory  muscles. CARDIOVASCULAR: Heart is regular without murmurs, gallops, or rubs. GI: The abdomen is soft, tender to palpation in the right upper quadrant, and nondistended. There are no palpable masses. There is no hepatosplenomegaly. There are normal bowel sounds in all quadrants. GU: Rectal deferred.   MUSCULOSKELETAL: Normal muscle strength and tone. No cyanosis or edema.   SKIN: Turgor is good and there are no pathologic skin lesions or ulcers. NEUROLOGIC: Motor and sensation is grossly normal. Cranial nerves are grossly intact. PSYCH:  Oriented to person, place and time. Affect is normal.  Data Reviewed Images and labs from the ER reviewed. Labs are all within normal limits and ultrasound shows cholelithiasis and possible chronic cholecystitis. I have personally reviewed the patient's imaging, laboratory findings and medical records.    Assessment    Clinical cholecystitis    Plan    40 year old female with persistent right upper quadrant abdominal pain in the setting of known gallstones. This is consistent with cholecystitis. I discussed the procedure of a laparoscopic cholecystectomy in detail. We discussed the risks and benefits of a laparoscopic cholecystectomy and possible cholangiogram including, but not limited to bleeding, infection, injury to surrounding structures such as the intestine or liver, bile leak, retained gallstones, need to convert to an open procedure, prolonged diarrhea, blood clots such as  DVT, common bile duct injury, anesthesia risks, and possible need for additional procedures.  The likelihood of improvement in symptoms and return to the patient's normal status is good. We discussed the typical post-operative recovery course. Patient voiced understanding. We will recheck labs on an inpatient status. She has a documented history of cocaine use and a urine drug screen will also be obtained. Tentatively plan for operative intervention tomorrow.      Time spent with  the patient was 45 minutes, with more than 50% of the time spent in face-to-face education, counseling and care coordination.     Ricarda Frameharles Ibn Stief, MD FACS General Surgeon 12/11/2016, 5:07 PM

## 2016-12-11 NOTE — Patient Instructions (Signed)
We would like for you to go straight to the admission desk at [facility] for direct admit. Please call our office if you have any questions.

## 2016-12-12 ENCOUNTER — Inpatient Hospital Stay: Payer: Medicare Other | Admitting: Registered Nurse

## 2016-12-12 ENCOUNTER — Encounter
Admission: AD | Disposition: A | Discharge status: Home / Self Care | Payer: Self-pay | Source: Ambulatory Visit | Attending: Surgery

## 2016-12-12 ENCOUNTER — Ambulatory Visit: Payer: Medicare Other | Admitting: Surgery

## 2016-12-12 DIAGNOSIS — K810 Acute cholecystitis: Secondary | ICD-10-CM | POA: Diagnosis not present

## 2016-12-12 HISTORY — PX: CHOLECYSTECTOMY: SHX55

## 2016-12-12 LAB — URINE CULTURE

## 2016-12-12 SURGERY — LAPAROSCOPIC CHOLECYSTECTOMY
Anesthesia: General | Wound class: Clean Contaminated

## 2016-12-12 MED ORDER — KETOROLAC TROMETHAMINE 30 MG/ML IJ SOLN
INTRAMUSCULAR | Status: DC | PRN
  Administered 2016-12-12: 30 mg via INTRAVENOUS

## 2016-12-12 MED ORDER — MIDAZOLAM HCL 2 MG/2ML IJ SOLN
INTRAMUSCULAR | Status: DC | PRN
  Administered 2016-12-12: 2 mg via INTRAVENOUS

## 2016-12-12 MED ORDER — ACETAMINOPHEN 10 MG/ML IV SOLN
INTRAVENOUS | Status: DC | PRN
  Administered 2016-12-12: 1000 mg via INTRAVENOUS

## 2016-12-12 MED ORDER — HYDROMORPHONE HCL 1 MG/ML IJ SOLN
INTRAMUSCULAR | Status: AC
  Filled 2016-12-12: qty 1

## 2016-12-12 MED ORDER — PROPOFOL 10 MG/ML IV BOLUS
INTRAVENOUS | Status: DC | PRN
  Administered 2016-12-12: 40 mg via INTRAVENOUS
  Administered 2016-12-12: 200 mg via INTRAVENOUS

## 2016-12-12 MED ORDER — LIDOCAINE HCL (PF) 1 % IJ SOLN
INTRAMUSCULAR | Status: AC
  Filled 2016-12-12: qty 30

## 2016-12-12 MED ORDER — LIDOCAINE HCL (CARDIAC) 20 MG/ML IV SOLN
INTRAVENOUS | Status: DC | PRN
  Administered 2016-12-12: 80 mg via INTRAVENOUS

## 2016-12-12 MED ORDER — FENTANYL CITRATE (PF) 100 MCG/2ML IJ SOLN
INTRAMUSCULAR | Status: DC | PRN
  Administered 2016-12-12 (×7): 50 ug via INTRAVENOUS

## 2016-12-12 MED ORDER — ACETAMINOPHEN 10 MG/ML IV SOLN
INTRAVENOUS | Status: AC
  Filled 2016-12-12: qty 100

## 2016-12-12 MED ORDER — HYDROMORPHONE HCL 1 MG/ML IJ SOLN
INTRAMUSCULAR | Status: AC
  Administered 2016-12-12: 0.5 mg via INTRAVENOUS
  Filled 2016-12-12: qty 1

## 2016-12-12 MED ORDER — NYSTATIN 100000 UNIT/GM EX POWD
Freq: Three times a day (TID) | CUTANEOUS | Status: DC
  Administered 2016-12-12: 21:00:00 via TOPICAL
  Filled 2016-12-12: qty 15

## 2016-12-12 MED ORDER — FLUOXETINE HCL 20 MG PO CAPS
20.0000 mg | ORAL_CAPSULE | Freq: Every day | ORAL | Status: DC
  Administered 2016-12-13: 20 mg via ORAL
  Filled 2016-12-12: qty 1

## 2016-12-12 MED ORDER — HYDROMORPHONE HCL 1 MG/ML IJ SOLN
0.5000 mg | INTRAMUSCULAR | Status: DC | PRN
  Administered 2016-12-12 (×3): 0.5 mg via INTRAVENOUS

## 2016-12-12 MED ORDER — METHYLPHENIDATE HCL ER (CD) 20 MG PO CPCR: 20.0000 mg | ORAL_CAPSULE | Freq: Two times a day (BID) | ORAL | Status: DC

## 2016-12-12 MED ORDER — ONDANSETRON HCL 4 MG/2ML IJ SOLN: 4.0000 mg | Freq: Once | INTRAMUSCULAR | Status: DC | PRN

## 2016-12-12 MED ORDER — LIDOCAINE HCL 1 % IJ SOLN
INTRAMUSCULAR | Status: DC | PRN
  Administered 2016-12-12: 27 mL via SUBCUTANEOUS

## 2016-12-12 MED ORDER — BUPROPION HCL ER (SR) 100 MG PO TB12
200.0000 mg | ORAL_TABLET | Freq: Every day | ORAL | Status: DC
  Administered 2016-12-13: 200 mg via ORAL
  Filled 2016-12-12: qty 2

## 2016-12-12 MED ORDER — ONDANSETRON HCL 4 MG/2ML IJ SOLN
INTRAMUSCULAR | Status: DC | PRN
  Administered 2016-12-12: 4 mg via INTRAVENOUS

## 2016-12-12 MED ORDER — SUGAMMADEX SODIUM 200 MG/2ML IV SOLN
INTRAVENOUS | Status: DC | PRN
Start: 2016-12-12 — End: 2016-12-12
  Administered 2016-12-12: 200 mg via INTRAVENOUS

## 2016-12-12 MED ORDER — FENTANYL CITRATE (PF) 100 MCG/2ML IJ SOLN: 25.0000 ug | INTRAMUSCULAR | Status: DC | PRN

## 2016-12-12 MED ORDER — BUPIVACAINE HCL (PF) 0.5 % IJ SOLN
INTRAMUSCULAR | Status: AC
  Filled 2016-12-12: qty 30

## 2016-12-12 MED ORDER — HYDROCODONE-ACETAMINOPHEN 5-325 MG PO TABS
1.0000 | ORAL_TABLET | ORAL | Status: DC | PRN
  Administered 2016-12-12 (×2): 1 via ORAL
  Administered 2016-12-13 (×2): 2 via ORAL
  Filled 2016-12-12: qty 2
  Filled 2016-12-12 (×2): qty 1
  Filled 2016-12-12: qty 2

## 2016-12-12 MED ORDER — ROCURONIUM BROMIDE 100 MG/10ML IV SOLN
INTRAVENOUS | Status: DC | PRN
  Administered 2016-12-12: 10 mg via INTRAVENOUS

## 2016-12-12 SURGICAL SUPPLY — 43 items
ADHESIVE MASTISOL STRL (MISCELLANEOUS) ×3 IMPLANT
APPLIER CLIP ROT 10 11.4 M/L (STAPLE) ×3
BAG COUNTER SPONGE EZ (MISCELLANEOUS) ×2 IMPLANT
BLADE SURG SZ11 CARB STEEL (BLADE) ×3 IMPLANT
CANISTER SUCT 1200ML W/VALVE (MISCELLANEOUS) ×3 IMPLANT
CATH CHOLANG 76X19 KUMAR (CATHETERS) ×3 IMPLANT
CHLORAPREP W/TINT 26ML (MISCELLANEOUS) ×3 IMPLANT
CLIP APPLIE ROT 10 11.4 M/L (STAPLE) ×1 IMPLANT
CLOSURE WOUND 1/2 X4 (GAUZE/BANDAGES/DRESSINGS)
CONRAY 60ML FOR OR (MISCELLANEOUS) IMPLANT
COUNTER SPONGE BAG EZ (MISCELLANEOUS) ×1
DRAPE SHEET LG 3/4 BI-LAMINATE (DRAPES) ×3 IMPLANT
DRESSING TELFA 4X3 1S ST N-ADH (GAUZE/BANDAGES/DRESSINGS) ×3 IMPLANT
DRSG TEGADERM 2-3/8X2-3/4 SM (GAUZE/BANDAGES/DRESSINGS) ×12 IMPLANT
ELECT REM PT RETURN 9FT ADLT (ELECTROSURGICAL) ×3
ELECTRODE REM PT RTRN 9FT ADLT (ELECTROSURGICAL) ×1 IMPLANT
GLOVE BIO SURGEON STRL SZ7.5 (GLOVE) ×3 IMPLANT
GLOVE INDICATOR 8.0 STRL GRN (GLOVE) ×3 IMPLANT
GOWN STRL REUS W/ TWL LRG LVL3 (GOWN DISPOSABLE) ×3 IMPLANT
GOWN STRL REUS W/TWL LRG LVL3 (GOWN DISPOSABLE) ×6
GRASPER SUT TROCAR 14GX15 (MISCELLANEOUS) ×3 IMPLANT
IRRIGATION STRYKERFLOW (MISCELLANEOUS) IMPLANT
IRRIGATOR STRYKERFLOW (MISCELLANEOUS)
IV NS 1000ML (IV SOLUTION)
IV NS 1000ML BAXH (IV SOLUTION) IMPLANT
L-HOOK LAP DISP 36CM (ELECTROSURGICAL) ×3
LABEL OR SOLS (LABEL) ×3 IMPLANT
LHOOK LAP DISP 36CM (ELECTROSURGICAL) ×1 IMPLANT
NEEDLE HYPO 25X1 1.5 SAFETY (NEEDLE) ×3 IMPLANT
NEEDLE VERESS 14GA 120MM (NEEDLE) ×3 IMPLANT
NS IRRIG 500ML POUR BTL (IV SOLUTION) ×3 IMPLANT
PACK LAP CHOLECYSTECTOMY (MISCELLANEOUS) ×3 IMPLANT
PENCIL ELECTRO HAND CTR (MISCELLANEOUS) ×3 IMPLANT
POUCH ENDO CATCH 10MM SPEC (MISCELLANEOUS) ×3 IMPLANT
SCISSORS METZENBAUM CVD 33 (INSTRUMENTS) ×3 IMPLANT
SLEEVE ENDOPATH XCEL 5M (ENDOMECHANICALS) ×6 IMPLANT
STRIP CLOSURE SKIN 1/2X4 (GAUZE/BANDAGES/DRESSINGS) IMPLANT
SUT MNCRL 4-0 (SUTURE) ×2
SUT MNCRL 4-0 27XMFL (SUTURE) ×1
SUTURE MNCRL 4-0 27XMF (SUTURE) ×1 IMPLANT
TROCAR XCEL 12X100 BLDLESS (ENDOMECHANICALS) ×3 IMPLANT
TROCAR XCEL NON-BLD 5MMX100MML (ENDOMECHANICALS) ×3 IMPLANT
TUBING INSUFFLATOR HI FLOW (MISCELLANEOUS) ×3 IMPLANT

## 2016-12-12 NOTE — Op Note (Signed)
Laparoscopic Cholecystectomy  Pre-operative Diagnosis: Acute cholecystitis  Post-operative Diagnosis: Same  Procedure: Laparoscopic cholecystectomy  Surgeon: Leonette Mostharles T. Tonita CongWoodham, MD FACS  Anesthesia: Gen. with endotracheal tube  Assistant: None  Procedure Details  The patient was seen again in the Holding Room. The benefits, complications, treatment options, and expected outcomes were discussed with the patient. The risks of bleeding, infection, recurrence of symptoms, failure to resolve symptoms, bile duct damage, bile duct leak, retained common bile duct stone, bowel injury, any of which could require further surgery and/or ERCP, stent, or papillotomy were reviewed with the patient. The likelihood of improving the patient's symptoms with return to their baseline status is good.  The patient and/or family concurred with the proposed plan, giving informed consent.  The patient was taken to Operating Room, identified as Shannon Campbell and the procedure verified as Laparoscopic Cholecystectomy.  A Time Out was held and the above information confirmed.  Prior to the induction of general anesthesia, antibiotic prophylaxis was administered. VTE prophylaxis was in place. General endotracheal anesthesia was then administered and tolerated well. After the induction, the abdomen was prepped with Chloraprep and draped in the sterile fashion. The patient was positioned in the supine position.  Local anesthetic  was injected into the skin near the umbilicus and an incision made. The Veress needle was placed. Pneumoperitoneum was then created with CO2 and tolerated well without any adverse changes in the patient's vital signs. A 5mm port was placed in the periumbilical position and the abdominal cavity was explored.  Two 5-mm ports were placed in the right upper quadrant and a 12 mm epigastric port was placed all under direct vision. All skin incisions  were infiltrated with a local anesthetic agent before  making the incision and placing the trocars.   The patient was positioned  in reverse Trendelenburg, tilted slightly to the patient's left.  The gallbladder was identified, the fundus grasped and retracted cephalad. Adhesions were lysed bluntly. The infundibulum was grasped and retracted laterally, exposing the peritoneum overlying the triangle of Calot. This was then divided and exposed in a blunt fashion. A critical view of the cystic duct and cystic artery was obtained.  The cystic duct was clearly identified and bluntly dissected. The duct and artery were serially clipped with endoclips and cut in between with Endo Shears.  The gallbladder was taken from the gallbladder fossa in a retrograde fashion with the electrocautery. The posterior of the gallbladder was inadvertently entered into with electrocautery. The gallbladder was removed and placed in an Endocatch bag. The liver bed was irrigated and inspected. Hemostasis was achieved with the electrocautery. Copious irrigation was utilized and was repeatedly aspirated until clear.  The gallbladder and Endocatch sac were then removed through the epigastric port site.   Inspection of the right upper quadrant was performed. No bleeding, bile duct injury or leak, or bowel injury was noted. Pneumoperitoneum was released.  4-0 subcuticular Monocryl was used to close the skin. Steristrips and Mastisol and sterile dressings were  applied.  The patient was then extubated and brought to the recovery room in stable condition. Sponge, lap, and needle counts were correct at closure and at the conclusion of the case.   Findings: Acute Cholecystitis   Estimated Blood Loss: 10 mL         Drains: None         Specimens: Gallbladder           Complications: none  [name] T. Adonis Huguenin, MD, FACS

## 2016-12-12 NOTE — Progress Notes (Signed)
CC: Cholecystitis Subjective: Patient reports that her right upper quadrant pain has persisted overnight. She denies any fevers or chills but states she is ready to have this gallbladder out so that her pain can be relieved.  Objective: Vital signs in last 24 hours: Temp:  [97.8 F (36.6 C)-98.4 F (36.9 C)] 97.8 F (36.6 C) (12/19 0450) Pulse Rate:  [63-90] 63 (12/19 0450) Resp:  [16-24] 20 (12/19 0450) BP: (99-128)/(63-85) 99/63 (12/19 0450) SpO2:  [100 %] 100 % (12/19 0450) Weight:  [78.9 kg (174 lb)-79.3 kg (174 lb 14.4 oz)] 79.3 kg (174 lb 14.4 oz) (12/18 1644) Last BM Date: 12/11/16  Intake/Output from previous day: 12/18 0701 - 12/19 0700 In: 1134.6 [I.V.:1034.6; IV Piggyback:100] Out: 300 [Urine:300] Intake/Output this shift: No intake/output data recorded.  Physical exam:  Gen.: No acute distress Chest: Clear to auscultation Heart: Regular rate and rhythm Abdomen: Soft, tender to palpation right upper quadrant, nondistended.  Lab Results: CBC   Recent Labs  12/10/16 1025 12/11/16 1734  WBC 5.3 7.0  HGB 15.0 14.2  HCT 42.8 40.6  PLT 257 247   BMET  Recent Labs  12/10/16 1025 12/11/16 1734  NA 137 136  K 4.0 3.5  CL 110 106  CO2 21* 24  GLUCOSE 104* 88  BUN 17 12  CREATININE 0.87 0.84  CALCIUM 9.0 8.8*   PT/INR No results for input(s): LABPROT, INR in the last 72 hours. ABG No results for input(s): PHART, HCO3 in the last 72 hours.  Invalid input(s): PCO2, PO2  Studies/Results: [place] Abdomen Limited Ruq  Result Date: 12/10/2016 CLINICAL DATA:  Right upper quadrant pain EXAM: US ABDOMEN LIMITED - RIGHT UPPER QUADRANT COMPARISON:  CT 07/13/2006 FINDINGS: Gallbladder: Single mobile gallstone, measuring 11 mm. There is focal gallbladder wall thickening, measuring up to 6 mm. Negative sonographic Murphy's. Common bile duct: Diameter: Normal caliber, 4 mm Liver: Increased echotexture throughout the liver suggesting fatty infiltration. No focal  abnormality or biliary ductal dilatation. IMPRESSION: Cholelithiasis. Focal gallbladder wall thickening may be related to changes of chronic cholecystitis. No definite sonographic changes of acute cholecystitis. Fatty infiltration of the liver. Electronically Signed   By: Charlett NoseKevin  Dover M.D.   On: 12/10/2016 11:16    Anti-infectives: Anti-infectives    Start     Dose/Rate Route Frequency Ordered Stop   12/11/16 1800  Cefoxitin Sodium in Dextrose 2-1.1 GM/50ML SOLN 2 g  Status:  Discontinued     2 g Intravenous Every 8 hours 12/11/16 1640 12/11/16 1642   12/11/16 1800  cefOXitin (MEFOXIN) 2 g in dextrose 5 % 50 mL IVPB     2 g 100 mL/hr over 30 Minutes Intravenous Every 8 hours 12/11/16 1643     12/11/16 1630  cefOXitin (MEFOXIN) 2 g in dextrose 5 % 50 mL IVPB  Status:  Discontinued     2 g 100 mL/hr over 30 Minutes Intravenous Every 8 hours 12/11/16 1629 12/11/16 1637      Assessment/Plan:  40 year old female with findings consistent with acute cholecystitis. Again discussed the surgery and patient voiced her desire to proceed. Plan to proceed to the operating room later today for laparoscopic cholecystectomy.  Yalissa Fink T. Tonita CongWoodham, MD, FACS  12/12/2016

## 2016-12-12 NOTE — Transfer of Care (Signed)
Immediate Anesthesia Transfer of Care Note  Patient: Shannon Campbell  Procedure(s) Performed: Procedure(s): LAPAROSCOPIC CHOLECYSTECTOMY (N/A)  Patient Location: PACU  Anesthesia Type:General  Level of Consciousness: awake, alert  and oriented  Airway & Oxygen Therapy: Patient Spontanous Breathing and Patient connected to face mask oxygen  Post-op Assessment: Report given to RN and Post -op Vital signs reviewed and stable  Post vital signs: Reviewed and stable  Last Vitals:  Vitals:   12/12/16 1254 12/12/16 1511  BP: 118/73 (!) 148/90  Pulse: 75 86  Resp: 18 18  Temp: (!) 36.1 C 36.8 C    Last Pain:  Vitals:   12/12/16 1511  TempSrc:   PainSc: 10-Worst pain ever         Complications: No apparent anesthesia complications

## 2016-12-12 NOTE — Anesthesia Preprocedure Evaluation (Addendum)
Anesthesia Evaluation  Patient identified by MRN, date of birth, ID band Patient awake    Reviewed: Allergy & Precautions, NPO status , Patient's Chart, lab work & pertinent test results  Airway Mallampati: II  TM Distance: >3 FB     Dental  (+) Chipped   Pulmonary asthma , Current Smoker,    Pulmonary exam normal        Cardiovascular negative cardio ROS Normal cardiovascular exam     Neuro/Psych PSYCHIATRIC DISORDERS Anxiety Depression negative neurological ROS     GI/Hepatic Neg liver ROS,   Endo/Other  negative endocrine ROS  Renal/GU negative Renal ROS  negative genitourinary   Musculoskeletal  (+) Fibromyalgia -  Abdominal Normal abdominal exam  (+)   Peds negative pediatric ROS (+)  Hematology negative hematology ROS (+)   Anesthesia Other Findings Past Medical History: No date: Anxiety No date: Asthma No date: Depression No date: Fibromyalgia No date: History of cardiac arrest No date: Pap smear abnormality of cervix with LGSIL No date: Substance abuse No date: VAIN (vaginal intraepithelial neoplasia)  Reproductive/Obstetrics                            Anesthesia Physical Anesthesia Plan  ASA: II  Anesthesia Plan: General   Post-op Pain Management:    Induction: Intravenous  Airway Management Planned: Oral ETT  Additional Equipment:   Intra-op Plan:   Post-operative Plan: Extubation in OR  Informed Consent: I have reviewed the patients History and Physical, chart, labs and discussed the procedure including the risks, benefits and alternatives for the proposed anesthesia with the patient or authorized representative who has indicated his/her understanding and acceptance.   Dental advisory given  Plan Discussed with: CRNA and Surgeon  Anesthesia Plan Comments:         Anesthesia Quick Evaluation

## 2016-12-12 NOTE — Brief Op Note (Signed)
12/11/2016 - 12/12/2016  3:09 PM  PATIENT:  Shannon JacksonElizabeth Campbell  40 y.o. female  PRE-OPERATIVE DIAGNOSIS:  acute cholecystitis  POST-OPERATIVE DIAGNOSIS:  acute cholecystitis  PROCEDURE:  Procedure(s): LAPAROSCOPIC CHOLECYSTECTOMY (N/A)  SURGEON:  Surgeon(s) and Role:    * Ricarda Frameharles Dynisha Due, MD - Primary  PHYSICIAN ASSISTANT:   ASSISTANTS: none   ANESTHESIA:   general  EBL:  Total I/O In: 800 [I.V.:750; IV Piggyback:50] Out: 260 [Urine:250; Blood:10]  BLOOD ADMINISTERED:none  DRAINS: none   LOCAL MEDICATIONS USED:  MARCAINE   , XYLOCAINE  and Amount: 27 ml  SPECIMEN:  Source of Specimen:  gallbladder  DISPOSITION OF SPECIMEN:  PATHOLOGY  COUNTS:  YES  TOURNIQUET:  * No tourniquets in log *  DICTATION: .Dragon Dictation  PLAN OF CARE: Admit for overnight observation  PATIENT DISPOSITION:  PACU - hemodynamically stable.   Delay start of Pharmacological VTE agent (>24hrs) due to surgical blood loss or risk of bleeding: not applicable

## 2016-12-12 NOTE — Anesthesia Procedure Notes (Signed)
Procedure Name: Intubation Date/Time: 12/12/2016 2:18 PM Performed by: Karoline CaldwellSTARR, Sahas Sluka Pre-anesthesia Checklist: Patient identified, Emergency Drugs available, Suction available and Patient being monitored Patient Re-evaluated:Patient Re-evaluated prior to inductionOxygen Delivery Method: Circle system utilized Preoxygenation: Pre-oxygenation with 100% oxygen Intubation Type: IV induction Ventilation: Mask ventilation without difficulty Laryngoscope Size: Mac and 3 Grade View: Grade I Tube type: Oral Tube size: 7.0 mm Airway Equipment and Method: Stylet Placement Confirmation: ETT inserted through vocal cords under direct vision,  positive ETCO2 and breath sounds checked- equal and bilateral Secured at: 21 cm Tube secured with: Tape Dental Injury: Teeth and Oropharynx as per pre-operative assessment

## 2016-12-12 NOTE — Progress Notes (Signed)
Pt is a recovering addict and has had a recent relapse due to receiving narcotics in the hospital about a month ago. She currently has orders for Dilaudid and oxycodone and prefers to continue them on an as needed basis she does not exhibit any drug seeking tendencies and has taken very limited pain medications. She also voices that her medications at discharge need to be given to her mother not her or her husband so she is not tempted to take more than what is prescribed.

## 2016-12-13 ENCOUNTER — Encounter: Payer: Self-pay | Admitting: General Surgery

## 2016-12-13 MED ORDER — HYDROCODONE-ACETAMINOPHEN 5-325 MG PO TABS: 1.0000 | ORAL_TABLET | ORAL | 0 refills | Status: DC | PRN

## 2016-12-13 NOTE — Progress Notes (Signed)
Pt to be discharged per MD order. IV removed. All instructions reviewed with pt and spouse. Pt verbalizes understanding of discharge instructions. Pt has history of subtatance abuse and feels like she can take her Norco script without issues but prefers I give it to the husband so I did.

## 2016-12-13 NOTE — Discharge Summary (Signed)
Patient ID: Shannon Campbell MRN: [id number] DOB/AGE: November 23, 1976 40 y.o.  Admit date: 12/11/2016 Discharge date: 12/13/2016  Discharge Diagnoses:  Acute cholecystitis  Procedures Performed: Laparoscopic cholecystectomy  Discharged Condition: good  Hospital Course: Patient admitted from clinic with acute cholecystitis. Taken to the operating room for a laparoscopic cholecystectomy the next day. Tolerated the procedure well. On the day of discharge her pain was controlled with oral medications and she was tolerating a regular diet.  Discharge Orders:  discharge home  Disposition: 01-Home or Self Care  Discharge Medications: Allergies as of 12/13/2016      Reactions   Prednisone    Other reaction(s): Other (See Comments) Makes her moody      Medication List    STOP taking these medications   HYDROmorphone 2 MG tablet Commonly known as:  DILAUDID     TAKE these medications   buPROPion 200 MG 12 hr tablet Commonly known as:  WELLBUTRIN SR Take 200 mg by mouth daily.   cephALEXin 500 MG capsule Commonly known as:  KEFLEX Take 1 capsule (500 mg total) by mouth 2 (two) times daily.   FLUoxetine 20 MG capsule Commonly known as:  PROZAC Take 20 mg by mouth daily.   HYDROcodone-acetaminophen 5-325 MG tablet Commonly known as:  NORCO/VICODIN Take 1-2 tablets by mouth every 4 (four) hours as needed for moderate pain or severe pain.   methylphenidate 20 MG CR capsule Commonly known as:  METADATE CD Take 20 mg by mouth 2 (two) times daily.   nystatin 100000 UNIT/ML suspension Commonly known as:  MYCOSTATIN Take 5 mLs (500,000 Units total) by mouth 4 (four) times daily.   ondansetron 4 MG tablet Commonly known as:  ZOFRAN Take 1 tablet (4 mg total) by mouth every 8 (eight) hours as needed for nausea or vomiting.        Follwup: Follow-up Information    [facility] [place] 2 week(s).   Specialty:  General Surgery Why:   postop Contact information: [address] [place] [id number] [phone]          Signed: Ricarda FrameCharles Kaylei Frink 12/13/2016, 1:23 PM

## 2016-12-13 NOTE — Discharge Instructions (Signed)
Laparoscopic Cholecystectomy, Care After °This sheet gives you information about how to care for yourself after your procedure. Your health care provider may also give you more specific instructions. If you have problems or questions, contact your health care provider. °What can I expect after the procedure? °After the procedure, it is common to have: °· Pain at your incision sites. You will be given medicines to control this pain. °· Mild nausea or vomiting. °· Bloating and possible shoulder pain from the air-like gas that was used during the procedure. °Follow these instructions at home: °Incision care  ° °· Follow instructions from your health care provider about how to take care of your incisions. Make sure you: °¨ Wash your hands with soap and water before you change your bandage (dressing). If soap and water are not available, use hand sanitizer. °¨ Change your dressing as told by your health care provider. °¨ Leave stitches (sutures), skin glue, or adhesive strips in place. These skin closures may need to be in place for 2 weeks or longer. If adhesive strip edges start to loosen and curl up, you may trim the loose edges. Do not remove adhesive strips completely unless your health care provider tells you to do that. °· Do not take baths, swim, or use a hot tub until your health care provider approves. Ask your health care provider if you can take showers. You may only be allowed to take sponge baths for bathing. °· Check your incision area every day for signs of infection. Check for: °¨ More redness, swelling, or pain. °¨ More fluid or blood. °¨ Warmth. °¨ Pus or a bad smell. °Activity  °· Do not drive or use heavy machinery while taking prescription pain medicine. °· Do not lift anything that is heavier than 10 lb (4.5 kg) until your health care provider approves. °· Do not play contact sports until your health care provider approves. °· Do not drive for 24 hours if you were given a medicine to help you relax  (sedative). °· Rest as needed. Do not return to work or school until your health care provider approves. °General instructions  °· Take over-the-counter and prescription medicines only as told by your health care provider. °· To prevent or treat constipation while you are taking prescription pain medicine, your health care provider may recommend that you: °¨ Drink enough fluid to keep your urine clear or pale yellow. °¨ Take over-the-counter or prescription medicines. °¨ Eat foods that are high in fiber, such as fresh fruits and vegetables, whole grains, and beans. °¨ Limit foods that are high in fat and processed sugars, such as fried and sweet foods. °Contact a health care provider if: °· You develop a rash. °· You have more redness, swelling, or pain around your incisions. °· You have more fluid or blood coming from your incisions. °· Your incisions feel warm to the touch. °· You have pus or a bad smell coming from your incisions. °· You have a fever. °· One or more of your incisions breaks open. °Get help right away if: °· You have trouble breathing. °· You have chest pain. °· You have increasing pain in your shoulders. °· You faint or feel dizzy when you stand. °· You have severe pain in your abdomen. °· You have nausea or vomiting that lasts for more than one day. °· You have leg pain. °This information is not intended to replace advice given to you by your health care provider. Make sure you discuss any   questions you have with your health care provider. °Document Released: 12/11/2005 Document Revised: 07/01/2016 Document Reviewed: 05/29/2016 °Elsevier Interactive Patient Education © 2017 [employer] ° °

## 2016-12-13 NOTE — Progress Notes (Signed)
Iv slipped out. Pt has no iv access. Md notified. Per dr. Everlene Farrierpabon okay to leave IV out, pt tolerating liquids and likely d/c tomorrow. Will continue to monitor.

## 2016-12-14 LAB — SURGICAL PATHOLOGY

## 2016-12-14 NOTE — Anesthesia Postprocedure Evaluation (Signed)
Anesthesia Post Note  Patient: Delorise Jacksonlizabeth Dahle  Procedure(s) Performed: Procedure(s) (LRB): LAPAROSCOPIC CHOLECYSTECTOMY (N/A)  Patient location during evaluation: PACU Anesthesia Type: General Level of consciousness: awake and alert and oriented Pain management: pain level controlled Vital Signs Assessment: post-procedure vital signs reviewed and stable Respiratory status: spontaneous breathing Cardiovascular status: blood pressure returned to baseline Anesthetic complications: no     Last Vitals:  Vitals:   12/12/16 2210 12/13/16 0456  BP: 115/71 (!) 106/56  Pulse: 94 76  Resp: 18 18  Temp: 36.4 C 36.9 C    Last Pain:  Vitals:   12/13/16 0456  TempSrc: Oral  PainSc:                  Abdulai Blaylock

## 2016-12-27 ENCOUNTER — Ambulatory Visit: Payer: Medicare Other | Admitting: Surgery

## 2017-02-14 ENCOUNTER — Telehealth: Payer: Self-pay

## 2017-02-14 NOTE — Telephone Encounter (Signed)
Spoke with patient at this time. She explains that she has been "sick" since her gallbladder was removed but has bceome much worse in the last 2 weeks and she feels like it is because of her gallbladder.  She had a Laparoscopic Cholecystectomy on 12/12/16. Was scheduled for her post-op on 12/27/16 which she cancelled and never rescheduled.  She states that everything else is going ok. But goes 7-10 days between bowel movements and has been having diarrhea in between. She is nauseous when trying to eat. I explained to patient that she would need to get stool out with the use of a bottle of Magnesium Citrate today. She is then to use Miralax 17g daily every day after her bowels are evacuated. She was instructed to drink 72 oz. Or more water every day to help with Miralax.   She was placed on the schedule to see Dr. Tonita CongWoodham on 3/8 and asked to call back with any further questions prior to this.

## 2017-03-01 ENCOUNTER — Encounter: Payer: Medicare Other | Admitting: General Surgery

## 2017-03-18 ENCOUNTER — Emergency Department: Payer: Medicare Other

## 2017-03-18 ENCOUNTER — Encounter: Payer: Self-pay | Admitting: *Deleted

## 2017-03-18 ENCOUNTER — Emergency Department
Admission: EM | Admit: 2017-03-18 | Discharge: 2017-03-18 | Disposition: A | Discharge status: Home / Self Care | Payer: Medicare Other | Attending: Emergency Medicine | Admitting: Emergency Medicine

## 2017-03-18 DIAGNOSIS — Y939 Activity, unspecified: Secondary | ICD-10-CM | POA: Insufficient documentation

## 2017-03-18 DIAGNOSIS — B370 Candidal stomatitis: Secondary | ICD-10-CM

## 2017-03-18 DIAGNOSIS — F17210 Nicotine dependence, cigarettes, uncomplicated: Secondary | ICD-10-CM | POA: Diagnosis not present

## 2017-03-18 DIAGNOSIS — S93401A Sprain of unspecified ligament of right ankle, initial encounter: Secondary | ICD-10-CM | POA: Diagnosis not present

## 2017-03-18 DIAGNOSIS — J45909 Unspecified asthma, uncomplicated: Secondary | ICD-10-CM | POA: Diagnosis not present

## 2017-03-18 DIAGNOSIS — B379 Candidiasis, unspecified: Secondary | ICD-10-CM | POA: Diagnosis not present

## 2017-03-18 DIAGNOSIS — W010XXA Fall on same level from slipping, tripping and stumbling without subsequent striking against object, initial encounter: Secondary | ICD-10-CM | POA: Diagnosis not present

## 2017-03-18 DIAGNOSIS — S99911A Unspecified injury of right ankle, initial encounter: Secondary | ICD-10-CM | POA: Diagnosis present

## 2017-03-18 DIAGNOSIS — Y929 Unspecified place or not applicable: Secondary | ICD-10-CM | POA: Insufficient documentation

## 2017-03-18 DIAGNOSIS — Y999 Unspecified external cause status: Secondary | ICD-10-CM | POA: Diagnosis not present

## 2017-03-18 MED ORDER — IBUPROFEN 800 MG PO TABS
800.0000 mg | ORAL_TABLET | Freq: Once | ORAL | Status: AC
  Administered 2017-03-18: 800 mg via ORAL
  Filled 2017-03-18: qty 1

## 2017-03-18 MED ORDER — NYSTATIN 100000 UNIT/ML MT SUSP: 5.0000 mL | Freq: Four times a day (QID) | OROMUCOSAL | 0 refills | Status: DC

## 2017-03-18 MED ORDER — IBUPROFEN 600 MG PO TABS: 600.0000 mg | ORAL_TABLET | Freq: Four times a day (QID) | ORAL | 0 refills | Status: DC | PRN

## 2017-03-18 NOTE — Discharge Instructions (Signed)
Follow up with the orthopedic doctor for symptoms that are not improving over the week. Return to the ER for symptoms that change or worsen if unable to schedule an appointment.

## 2017-03-18 NOTE — ED Provider Notes (Signed)
[facility] Emergency Department Provider Note ____________________________________________  Time seen: Approximately 5:19 PM  I have reviewed the triage vital signs and the nursing notes.   HISTORY  Chief Complaint Extremity Laceration    HPI Shannon Campbell is a 41 y.o. female who presents to the ER for evaluation of right foot and ankle pain. 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 She is now staying with family who is helping her "detox." She also feels that she may have thrush in her mouth, which happens often during times of drug and alcohol use. She has not taken anything at all for pain for the past several days.  Past Medical History:  Diagnosis Date  . Anxiety   . Asthma   . Depression   . Fibromyalgia   . History of cardiac arrest   . Pap smear abnormality of cervix with LGSIL   . Substance abuse   . VAIN (vaginal intraepithelial neoplasia)     Patient Active Problem List   Diagnosis Date Noted  . Calculus of gallbladder with acute cholecystitis without obstruction 12/11/2016  . Acute cholecystitis 12/11/2016  . Uncomplicated alcohol dependence (HCC) 10/14/2016  . Cervical dysplasia 08/09/2016  . Chest pain 08/07/2016  . Chronic fatigue 11/01/2015  . Chronic pain of multiple joints 11/01/2015  . Elevated antinuclear antibody (ANA) level 11/01/2015  . PMS (premenstrual syndrome) 07/09/2015  . Tobacco abuse 07/09/2015  . Asthma without status asthmaticus 07/08/2015  . Chicken pox 07/08/2015  . Clinical depression 07/08/2015  . 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 Inflamed nasal mucosa 07/08/2015  . Avitaminosis D 07/08/2015    Past Surgical History:  Procedure Laterality Date  . ABDOMINAL HYSTERECTOMY  2013  . CESAREAN SECTION  1999  . CHOLECYSTECTOMY N/A 12/12/2016   Procedure: LAPAROSCOPIC CHOLECYSTECTOMY;  Surgeon: Ricarda Frame, MD;  Location: [facility] ORS;  Service: General;   Laterality: N/A;  . HERNIA REPAIR  2002  . NASAL SINUS SURGERY    . WRIST SURGERY      Prior to Admission medications   Medication Sig Start Date End Date Taking? Authorizing Provider  buPROPion (WELLBUTRIN SR) 200 MG 12 hr tablet Take 200 mg by mouth daily.    Historical Provider, MD  FLUoxetine (PROZAC) 20 MG capsule Take 20 mg by mouth daily.     Historical Provider, MD  HYDROcodone-acetaminophen (NORCO/VICODIN) 5-325 MG tablet Take 1-2 tablets by mouth every 4 (four) hours as needed for moderate pain or severe pain. 12/13/16   Ricarda Frame, MD  ibuprofen (ADVIL,MOTRIN) 600 MG tablet Take 1 tablet (600 mg total) by mouth every 6 (six) hours as needed. 03/18/17   Chinita Pester, FNP  methylphenidate (METADATE CD) 20 MG CR capsule Take 20 mg by mouth 2 (two) times daily.    Historical Provider, MD  nystatin (MYCOSTATIN) 100000 UNIT/ML suspension Take 5 mLs (500,000 Units total) by mouth 4 (four) times daily. 03/18/17   Chinita Pester, FNP  ondansetron (ZOFRAN) 4 MG tablet Take 1 tablet (4 mg total) by mouth every 8 (eight) hours as needed for nausea or vomiting. Patient not taking: Reported on 12/12/2016 12/10/16   Jennye Moccasin, MD    Allergies Prednisone  Family History  Problem Relation Age of Onset  . Thyroid disease Mother   . Hypertension Father   . Hemochromatosis Father     Social History Social History  Substance Use Topics  . Smoking status: Current Every Day Smoker  Packs/day: 1.00    Years: 15.00    Types: Cigarettes    Start date: 10/11/2014  . Smokeless tobacco: Never Used     Comment: nicotine  . Alcohol use No     Comment: in recovery for 3.5 years; last used ETOH 1 month ago    Review of Systems Constitutional: No recent illness. Cardiovascular: Denies chest pain or palpitations. Respiratory: Denies shortness of breath. Musculoskeletal: Pain in right ankle Skin: Positive for thrush on tongue and buccal surfaces. Neurological: Negative for  focal weakness or numbness.  ____________________________________________   PHYSICAL EXAM:  VITAL SIGNS: ED Triage Vitals  Enc Vitals Group     BP 03/18/17 1657 135/85     Pulse Rate 03/18/17 1657 97     Resp 03/18/17 1655 20     Temp 03/18/17 1655 98 F (36.7 C)     Temp Source 03/18/17 1655 Oral     SpO2 03/18/17 1657 98 %     Weight 03/18/17 1656 170 lb (77.1 kg)     Height 03/18/17 1656 5\' 3"  (1.6 m)     Head Circumference --      Peak Flow --      Pain Score 03/18/17 1657 10     Pain Loc --      Pain Edu? --      Excl. in GC? --     Constitutional: Alert and oriented. Well appearing and in no acute distress. Eyes: Conjunctivae are normal. EOMI. Head: Atraumatic. Neck: No stridor.  Respiratory: Normal respiratory effort.   Musculoskeletal: [place] ankle rules positive on right. Ankle ecchymotic and swollen. Left ankle mildly swollen, but no deformity or evidence of injury. Neurologic:  Normal speech and language. No gross focal neurologic deficits are appreciated. Speech is normal. No gait instability. Skin:  Thick, white substance on buccal surfaces and tongue. Psychiatric: Mood and affect are normal. Speech and behavior are normal.  ____________________________________________   LABS (all labs ordered are listed, but only abnormal results are displayed)  Labs Reviewed - No data to display ____________________________________________  RADIOLOGY  Right foot and ankle both negative for acute bony abnormality per radiology. ____________________________________________   PROCEDURES  Procedure(s) performed: Velcro ankle stirrup splint applied to the right ankle by ER tech. Patient neurovascularly intact post application.   ____________________________________________   INITIAL IMPRESSION / ASSESSMENT AND PLAN / ED COURSE  40 year old female presenting to the emergency department for evaluation of thrush and right ankle pain. X-rays of the foot and ankle are  negative for acute bony abnormality. She was placed in a supportive splint and given crutches and a prescription for ibuprofen. She was also given a prescription for nystatin for thrush. She was instructed to follow-up with orthopedics if the ankle is not improving over the week and her primary care provider for thrush that doesn't seem to be improving with the nystatin. She was instructed to return to the emergency department for symptoms that change or worsen if she is unable schedule appointments.  Pertinent labs & imaging results that were available during my care of the patient were reviewed by me and considered in my medical decision making (see chart for details).  _________________________________________   FINAL CLINICAL IMPRESSION(S) / ED DIAGNOSES  Final diagnoses:  Sprain of right ankle, unspecified ligament, initial encounter  Thrush    New Prescriptions   IBUPROFEN (ADVIL,MOTRIN) 600 MG TABLET    Take 1 tablet (600 mg total) by mouth every 6 (six) hours as needed.   NYSTATIN (  MYCOSTATIN) 100000 UNIT/ML SUSPENSION    Take 5 mLs (500,000 Units total) by mouth 4 (four) times daily.    If controlled substance prescribed during this visit, 12 month history viewed on the [facility] prior to issuing an initial prescription for Schedule II or III opiod.    Chinita PesterCari B Rudolph Dobler, FNP 03/18/17 1839    Loleta Roseory Forbach, MD 03/18/17 2135

## 2017-03-18 NOTE — ED Triage Notes (Signed)
Pt tripped and fell , injured right foot, pt denies any other problems

## 2017-04-27 ENCOUNTER — Emergency Department: Payer: Medicare Other

## 2017-04-27 ENCOUNTER — Encounter: Payer: Self-pay | Admitting: Emergency Medicine

## 2017-04-27 ENCOUNTER — Emergency Department
Admission: EM | Admit: 2017-04-27 | Discharge: 2017-04-27 | Disposition: A | Discharge status: Home / Self Care | Payer: Medicare Other | Attending: Student in an Organized Health Care Education/Training Program | Admitting: Student in an Organized Health Care Education/Training Program

## 2017-04-27 DIAGNOSIS — F17210 Nicotine dependence, cigarettes, uncomplicated: Secondary | ICD-10-CM | POA: Insufficient documentation

## 2017-04-27 DIAGNOSIS — K5900 Constipation, unspecified: Secondary | ICD-10-CM | POA: Diagnosis not present

## 2017-04-27 DIAGNOSIS — Z79899 Other long term (current) drug therapy: Secondary | ICD-10-CM | POA: Insufficient documentation

## 2017-04-27 DIAGNOSIS — J45909 Unspecified asthma, uncomplicated: Secondary | ICD-10-CM | POA: Insufficient documentation

## 2017-04-27 DIAGNOSIS — R1084 Generalized abdominal pain: Secondary | ICD-10-CM | POA: Diagnosis present

## 2017-04-27 LAB — URINALYSIS, COMPLETE (UACMP) WITH MICROSCOPIC
Bilirubin Urine: NEGATIVE
GLUCOSE, UA: NEGATIVE mg/dL
KETONES UR: NEGATIVE mg/dL
Leukocytes, UA: NEGATIVE
Nitrite: NEGATIVE
PROTEIN: NEGATIVE mg/dL
RBC / HPF: NONE SEEN RBC/hpf (ref 0–5)
Specific Gravity, Urine: 1.011 (ref 1.005–1.030)
pH: 5 (ref 5.0–8.0)

## 2017-04-27 LAB — COMPREHENSIVE METABOLIC PANEL
ALBUMIN: 4.2 g/dL (ref 3.5–5.0)
ALT: 31 U/L (ref 14–54)
ANION GAP: 7 (ref 5–15)
AST: 24 U/L (ref 15–41)
Alkaline Phosphatase: 68 U/L (ref 38–126)
BUN: 11 mg/dL (ref 6–20)
CO2: 24 mmol/L (ref 22–32)
Calcium: 9.2 mg/dL (ref 8.9–10.3)
Chloride: 103 mmol/L (ref 101–111)
Creatinine, Ser: 0.86 mg/dL (ref 0.44–1.00)
GFR calc Af Amer: >60 mL/min (ref >60)
GFR calc non Af Amer: >60 mL/min (ref >60)
GLUCOSE: 85 mg/dL (ref 65–99)
POTASSIUM: 3.6 mmol/L (ref 3.5–5.1)
SODIUM: 134 mmol/L — AB (ref 135–145)
Total Bilirubin: 0.8 mg/dL (ref 0.3–1.2)
Total Protein: 7.7 g/dL (ref 6.5–8.1)

## 2017-04-27 LAB — TROPONIN I

## 2017-04-27 LAB — CBC
HEMATOCRIT: 44.7 % (ref 35.0–47.0)
HEMOGLOBIN: 15.5 g/dL (ref 12.0–16.0)
MCH: 31.8 pg (ref 26.0–34.0)
MCHC: 34.7 g/dL (ref 32.0–36.0)
MCV: 91.7 fL (ref 80.0–100.0)
Platelets: 277 10*3/uL (ref 150–440)
RBC: 4.88 MIL/uL (ref 3.80–5.20)
RDW: 13.5 % (ref 11.5–14.5)
WBC: 6.1 10*3/uL (ref 3.6–11.0)

## 2017-04-27 LAB — LIPASE, BLOOD: Lipase: 23 U/L (ref 11–51)

## 2017-04-27 MED ORDER — POLYETHYLENE GLYCOL 3350 17 G PO PACK: 17.0000 g | PACK | Freq: Every day | ORAL | 0 refills | Status: DC

## 2017-04-27 MED ORDER — DICYCLOMINE HCL 10 MG PO CAPS: 10.0000 mg | ORAL_CAPSULE | Freq: Three times a day (TID) | ORAL | 0 refills | Status: DC | PRN

## 2017-04-27 MED ORDER — FENTANYL CITRATE (PF) 100 MCG/2ML IJ SOLN
100.0000 ug | INTRAMUSCULAR | Status: DC | PRN
  Administered 2017-04-27: 100 ug via INTRAVENOUS
  Filled 2017-04-27: qty 2

## 2017-04-27 MED ORDER — SODIUM CHLORIDE 0.9 % IV BOLUS (SEPSIS)
1000.0000 mL | Freq: Once | INTRAVENOUS | Status: AC
  Administered 2017-04-27: 1000 mL via INTRAVENOUS

## 2017-04-27 MED ORDER — IOPAMIDOL (ISOVUE-300) INJECTION 61%
100.0000 mL | Freq: Once | INTRAVENOUS | Status: AC | PRN
  Administered 2017-04-27: 100 mL via INTRAVENOUS

## 2017-04-27 MED ORDER — PROMETHAZINE HCL 25 MG/ML IJ SOLN
12.5000 mg | Freq: Once | INTRAMUSCULAR | Status: AC
  Administered 2017-04-27: 12.5 mg via INTRAVENOUS
  Filled 2017-04-27: qty 1

## 2017-04-27 MED ORDER — MORPHINE SULFATE (PF) 4 MG/ML IV SOLN: 4.0000 mg | INTRAVENOUS | Status: DC | PRN

## 2017-04-27 NOTE — ED Notes (Signed)
Pt taken to CT.

## 2017-04-27 NOTE — ED Triage Notes (Signed)
Pt reports continued RUQ abdominal pain since having gallbladder removed 11/2016. Pt reports pain increased past couple of days and radiates to her back. Pt reports bright red blood in stools. Pt reports ankle swelling and weight gain of 15 pounds in two weeks. Pt in no apparent distress in triage.

## 2017-04-27 NOTE — ED Provider Notes (Signed)
2  [facility] Emergency Department Provider Note    First MD Initiated Contact with Patient 04/27/17 1504     (approximate)  I have reviewed the triage vital signs and the nursing notes.   HISTORY  Chief Complaint Abdominal Pain and Blood In Stools     HPI Shannon Campbell is a 41 y.o. female with a history of anxiety depression and fibromyalgia also with a history of substance abuse presents with 1 month of worsening right upper quadrant pain and the location of her previous cholecystectomy. States that she came in today because the pain was 10 out of 10.  Describes it as a achy pain. No alleviating or worsening factors. No fevers at home. Is feeling nauseated but no vomiting. The pain radiates through to her back. Last week she did have an episode of bright red blood per rectum. This is only one time. Has not had any symptoms since then.   Past Medical History:  Diagnosis Date  . Anxiety   . Asthma   . Depression   . Fibromyalgia   . History of cardiac arrest   . Pap smear abnormality of cervix with LGSIL   . Substance abuse   . VAIN (vaginal intraepithelial neoplasia)    Family History  Problem Relation Age of Onset  . Thyroid disease Mother   . Hypertension Father   . Hemochromatosis Father    Past Surgical History:  Procedure Laterality Date  . ABDOMINAL HYSTERECTOMY  2013  . CESAREAN SECTION  1999  . CHOLECYSTECTOMY N/A 12/12/2016   Procedure: LAPAROSCOPIC CHOLECYSTECTOMY;  Surgeon: Ricarda Frame, MD;  Location: [facility] ORS;  Service: General;  Laterality: N/A;  . HERNIA REPAIR  2002  . NASAL SINUS SURGERY    . WRIST SURGERY     Patient Active Problem List   Diagnosis Date Noted  . Calculus of gallbladder with acute cholecystitis without obstruction 12/11/2016  . Acute cholecystitis 12/11/2016  . Uncomplicated alcohol dependence (HCC) 10/14/2016  . Cervical dysplasia 08/09/2016  . Chest pain 08/07/2016  . Chronic fatigue 11/01/2015    . Chronic pain of multiple joints 11/01/2015  . Elevated antinuclear antibody (ANA) level 11/01/2015  . PMS (premenstrual syndrome) 07/09/2015  . Tobacco abuse 07/09/2015  . Asthma without status asthmaticus 07/08/2015  . Chicken pox 07/08/2015  . Clinical depression 07/08/2015  . 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 Inflamed nasal mucosa 07/08/2015  . Avitaminosis D 07/08/2015      Prior to Admission medications   Medication Sig Start Date End Date Taking? Authorizing Provider  buPROPion (WELLBUTRIN SR) 200 MG 12 hr tablet Take 200 mg by mouth daily.    Historical Provider, MD  dicyclomine (BENTYL) 10 MG capsule Take 1 capsule (10 mg total) by mouth 3 (three) times daily as needed for spasms. 04/27/17 05/11/17  Willy Eddy, MD  dicyclomine (BENTYL) 10 MG capsule Take 1 capsule (10 mg total) by mouth 3 (three) times daily as needed for spasms. 04/27/17 05/11/17  Willy Eddy, MD  FLUoxetine (PROZAC) 20 MG capsule Take 20 mg by mouth daily.     Historical Provider, MD  HYDROcodone-acetaminophen (NORCO/VICODIN) 5-325 MG tablet Take 1-2 tablets by mouth every 4 (four) hours as needed for moderate pain or severe pain. 12/13/16   Ricarda Frame, MD  ibuprofen (ADVIL,MOTRIN) 600 MG tablet Take 1 tablet (600 mg total) by mouth every 6 (six) hours as needed. 03/18/17   Chinita Pester, FNP  methylphenidate (METADATE CD) 20 MG CR capsule Take 20  mg by mouth 2 (two) times daily.    Historical Provider, MD  nystatin (MYCOSTATIN) 100000 UNIT/ML suspension Take 5 mLs (500,000 Units total) by mouth 4 (four) times daily. 03/18/17   Chinita Pesterari B Triplett, FNP  ondansetron (ZOFRAN) 4 MG tablet Take 1 tablet (4 mg total) by mouth every 8 (eight) hours as needed for nausea or vomiting. Patient not taking: Reported on 12/12/2016 12/10/16   Jennye MoccasinBrian S Quigley, MD  polyethylene glycol [facility](MIRALAX / Ethelene HalGLYCOLAX) packet Take 17 g by mouth daily. Mix one tablespoon with 8oz of your favorite juice or water every day until you  are having soft formed stools. Then start taking once daily if you didn't have a stool the day before. 04/27/17   Willy EddyPatrick Preslyn Warr, MD  polyethylene glycol [facility](MIRALAX / Ethelene HalGLYCOLAX) packet Take 17 g by mouth daily. Mix one tablespoon with 8oz of your favorite juice or water every day until you are having soft formed stools. Then start taking once daily if you didn't have a stool the day before. 04/27/17   Willy EddyPatrick Kato Wieczorek, MD    Allergies Prednisone    Social History Social History  Substance Use Topics  . Smoking status: Current Every Day Smoker    Packs/day: 1.00    Years: 15.00    Types: Cigarettes    Start date: 10/11/2014  . Smokeless tobacco: Never Used     Comment: nicotine  . Alcohol use No     Comment: in recovery for 3.5 years; last used ETOH 1 month ago    Review of Systems Patient denies headaches, rhinorrhea, blurry vision, numbness, shortness of breath, chest pain, edema, cough, abdominal pain, nausea, vomiting, diarrhea, dysuria, fevers, rashes or hallucinations unless otherwise stated above in HPI. ____________________________________________   PHYSICAL EXAM:  VITAL SIGNS: Vitals:   04/27/17 1333 04/27/17 1600  BP: (!) 147/69 111/72  Pulse: (!) 104 76  Resp: 18   Temp: 98.4 F (36.9 C)     Constitutional: Alert and oriented. Well appearing and in no acute distress. Eyes: Conjunctivae are normal. PERRL. EOMI. Head: Atraumatic. Nose: No congestion/rhinnorhea. Mouth/Throat: Mucous membranes are moist.  Oropharynx non-erythematous. Neck: No stridor. Painless ROM. No cervical spine tenderness to palpation Hematological/Lymphatic/Immunilogical: No cervical lymphadenopathy. Cardiovascular: Normal rate, regular rhythm. Grossly normal heart sounds.  Good peripheral circulation. Respiratory: Normal respiratory effort.  No retractions. Lungs CTAB. Gastrointestinal: Soft with mild RUQ ttp. No distention. No abdominal bruits. No CVA tenderness. Genitourinary:    Musculoskeletal: No lower extremity tenderness nor edema.  No joint effusions. Neurologic:  Normal speech and language. No gross focal neurologic deficits are appreciated. No gait instability. Skin:  Skin is warm, dry and intact. No rash noted. Psychiatric: Mood and affect are normal. Speech and behavior are normal.  ____________________________________________   LABS (all labs ordered are listed, but only abnormal results are displayed)  Results for orders placed or performed during the hospital encounter of 04/27/17 (from the past 24 hour(s))  Lipase, blood     Status: None   Collection Time: 04/27/17  1:32 PM  Result Value Ref Range   Lipase 23 11 - 51 U/L  Comprehensive metabolic panel     Status: Abnormal   Collection Time: 04/27/17  1:32 PM  Result Value Ref Range   Sodium 134 (L) 135 - 145 mmol/L   Potassium 3.6 3.5 - 5.1 mmol/L   Chloride 103 101 - 111 mmol/L   CO2 24 22 - 32 mmol/L   Glucose, Bld 85 65 - 99 mg/dL  BUN 11 6 - 20 mg/dL   Creatinine, Ser 1.61 0.44 - 1.00 mg/dL   Calcium 9.2 8.9 - 09.6 mg/dL   Total Protein 7.7 6.5 - 8.1 g/dL   Albumin 4.2 3.5 - 5.0 g/dL   AST 24 15 - 41 U/L   ALT 31 14 - 54 U/L   Alkaline Phosphatase 68 38 - 126 U/L   Total Bilirubin 0.8 0.3 - 1.2 mg/dL   GFR calc non Af Amer >60 >60 mL/min   GFR calc Af Amer >60 >60 mL/min   Anion gap 7 5 - 15  CBC     Status: None   Collection Time: 04/27/17  1:32 PM  Result Value Ref Range   WBC 6.1 3.6 - 11.0 K/uL   RBC 4.88 3.80 - 5.20 MIL/uL   Hemoglobin 15.5 12.0 - 16.0 g/dL   HCT 04.5 40.9 - 81.1 %   MCV 91.7 80.0 - 100.0 fL   MCH 31.8 26.0 - 34.0 pg   MCHC 34.7 32.0 - 36.0 g/dL   RDW 91.4 78.2 - 95.6 %   Platelets 277 150 - 440 K/uL  Urinalysis, Complete w Microscopic     Status: Abnormal   Collection Time: 04/27/17  1:32 PM  Result Value Ref Range   Color, Urine YELLOW (A) YELLOW   APPearance CLEAR (A) CLEAR   Specific Gravity, Urine 1.011 1.005 - 1.030   pH 5.0 5.0 - 8.0    Glucose, UA NEGATIVE NEGATIVE mg/dL   Hgb urine dipstick SMALL (A) NEGATIVE   Bilirubin Urine NEGATIVE NEGATIVE   Ketones, ur NEGATIVE NEGATIVE mg/dL   Protein, ur NEGATIVE NEGATIVE mg/dL   Nitrite NEGATIVE NEGATIVE   Leukocytes, UA NEGATIVE NEGATIVE   RBC / HPF NONE SEEN 0 - 5 RBC/hpf   WBC, UA 0-5 0 - 5 WBC/hpf   Bacteria, UA RARE (A) NONE SEEN   Squamous Epithelial / LPF 0-5 (A) NONE SEEN   Mucous PRESENT   Troponin I     Status: None   Collection Time: 04/27/17  1:32 PM  Result Value Ref Range   Troponin I <0.03 <0.03 ng/mL   ____________________________________________  EKG My review and personal interpretation at Time: 13:41   Indication: ruq pain  Rate: 95  Rhythm: sinus Axis: normal Other: normal intervals, no STEMI ____________________________________________  RADIOLOGY  I personally reviewed all radiographic images ordered to evaluate for the above acute complaints and reviewed radiology reports and findings.  These findings were personally discussed with the patient.  Please see medical record for radiology report.  ____________________________________________   PROCEDURES  Procedure(s) performed:  Procedures    Critical Care performed: no ____________________________________________   INITIAL IMPRESSION / ASSESSMENT AND PLAN / ED COURSE  Pertinent labs & imaging results that were available during my care of the patient were reviewed by me and considered in my medical decision making (see chart for details).  DDX: hepatitis, eneteritis, constipation, dehydration,colitis, pancreatitis  Shannon Campbell is a 41 y.o. who presents to the ED with acute right upper quadrant abdominal pain as described above. Also complaining of an episode of bright red blood last week. Does have mild tenderness to palpation on exam. CT imaging ordered to evaluate for acute abnormality shows only large stool burden which I do suspect is the cause the patient's pain. Blood work is  otherwise reassuring. Patient tolerating oral hydration.  Have discussed with the patient and available family all diagnostics and treatments performed thus far and all questions were answered to  the best of my ability. The patient demonstrates understanding and agreement with plan.     ____________________________________________   FINAL CLINICAL IMPRESSION(S) / ED DIAGNOSES  Final diagnoses:  Generalized abdominal pain  Constipation, unspecified constipation type      NEW MEDICATIONS STARTED DURING THIS VISIT:  New Prescriptions   DICYCLOMINE (BENTYL) 10 MG CAPSULE    Take 1 capsule (10 mg total) by mouth 3 (three) times daily as needed for spasms.   DICYCLOMINE (BENTYL) 10 MG CAPSULE    Take 1 capsule (10 mg total) by mouth 3 (three) times daily as needed for spasms.   POLYETHYLENE GLYCOL (MIRALAX / GLYCOLAX) PACKET    Take 17 g by mouth daily. Mix one tablespoon with 8oz of your favorite juice or water every day until you are having soft formed stools. Then start taking once daily if you didn't have a stool the day before.   POLYETHYLENE GLYCOL (MIRALAX / GLYCOLAX) PACKET    Take 17 g by mouth daily. Mix one tablespoon with 8oz of your favorite juice or water every day until you are having soft formed stools. Then start taking once daily if you didn't have a stool the day before.     Note:  This document was prepared using Dragon voice recognition software and may include unintentional dictation errors.    Willy Eddy, MD 04/27/17 [phone]

## 2017-04-27 NOTE — Discharge Instructions (Signed)

## 2017-05-28 ENCOUNTER — Inpatient Hospital Stay: Payer: Medicare Other | Attending: Oncology | Admitting: Oncology

## 2017-05-28 ENCOUNTER — Ambulatory Visit: Payer: Medicare Other

## 2017-05-28 ENCOUNTER — Other Ambulatory Visit: Payer: Self-pay | Admitting: Oncology

## 2017-05-28 DIAGNOSIS — Z79899 Other long term (current) drug therapy: Secondary | ICD-10-CM | POA: Diagnosis not present

## 2017-05-28 DIAGNOSIS — F419 Anxiety disorder, unspecified: Secondary | ICD-10-CM | POA: Insufficient documentation

## 2017-05-28 DIAGNOSIS — F17210 Nicotine dependence, cigarettes, uncomplicated: Secondary | ICD-10-CM | POA: Insufficient documentation

## 2017-05-28 DIAGNOSIS — F329 Major depressive disorder, single episode, unspecified: Secondary | ICD-10-CM | POA: Diagnosis not present

## 2017-05-28 DIAGNOSIS — J45909 Unspecified asthma, uncomplicated: Secondary | ICD-10-CM | POA: Insufficient documentation

## 2017-05-28 DIAGNOSIS — Z8674 Personal history of sudden cardiac arrest: Secondary | ICD-10-CM | POA: Diagnosis not present

## 2017-05-28 DIAGNOSIS — F1010 Alcohol abuse, uncomplicated: Secondary | ICD-10-CM | POA: Diagnosis not present

## 2017-05-28 DIAGNOSIS — M797 Fibromyalgia: Secondary | ICD-10-CM | POA: Diagnosis not present

## 2017-05-28 LAB — CBC
HEMATOCRIT: 42.9 % (ref 35.0–47.0)
Hemoglobin: 14.8 g/dL (ref 12.0–16.0)
MCH: 31.7 pg (ref 26.0–34.0)
MCHC: 34.6 g/dL (ref 32.0–36.0)
MCV: 91.6 fL (ref 80.0–100.0)
Platelets: 275 10*3/uL (ref 150–440)
RBC: 4.68 MIL/uL (ref 3.80–5.20)
RDW: 13.1 % (ref 11.5–14.5)
WBC: 5.7 10*3/uL (ref 3.6–11.0)

## 2017-05-28 LAB — IRON AND TIBC
IRON: 136 ug/dL (ref 28–170)
Saturation Ratios: 39 % — ABNORMAL HIGH (ref 10.4–31.8)
TIBC: 353 ug/dL (ref 250–450)
UIBC: 217 ug/dL

## 2017-05-28 LAB — FERRITIN: FERRITIN: 80 ng/mL (ref 11–307)

## 2017-05-29 LAB — ERYTHROPOIETIN: Erythropoietin: 5.6 m[IU]/mL (ref 2.6–18.5)

## 2017-05-31 LAB — HEMOCHROMATOSIS DNA-PCR(C282Y,H63D)

## 2017-06-03 NOTE — Progress Notes (Signed)
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  INTERVAL HISTORY: Patient is a 41 year old female who is in clinic today with her son for polycythemia and reported that her father has hereditary hemochromatosis and was interested in being tested herself. She currently feels well and is asymptomatic. She has no neurologic complaints. She denies any recent fevers. She has a good appetite and denies weight loss. She has no chest pain or shortness of breath. She denies any nausea, vomiting, constipation, or diarrhea. She has no urinary complaints. Patient feels at her baseline and offers no specific complaints today.  REVIEW OF SYSTEMS:   Review of Systems  Constitutional: Negative.  Negative for fever, malaise/fatigue and weight loss.  Respiratory: Negative.  Negative for cough and shortness of breath.   Cardiovascular: Negative.  Negative for chest pain and leg swelling.  Gastrointestinal: Negative.  Negative for abdominal pain.  Genitourinary: Negative.   Musculoskeletal: Negative.   Skin: Negative.  Negative for rash.  Neurological: Negative.  Negative for sensory change and weakness.  Psychiatric/Behavioral: Negative.  The patient is not nervous/anxious.     As per HPI. Otherwise, a complete review of systems is negative.  PAST MEDICAL HISTORY: Past Medical History:  Diagnosis Date  . Anxiety   . Asthma   . Depression   . Fibromyalgia   . History of cardiac arrest   . Pap smear abnormality of cervix with LGSIL   . Substance abuse   . VAIN (vaginal intraepithelial neoplasia)     PAST SURGICAL HISTORY: Past Surgical History:  Procedure Laterality Date  . ABDOMINAL HYSTERECTOMY  2013  . CESAREAN SECTION  1999  .  CHOLECYSTECTOMY N/A 12/12/2016   Procedure: LAPAROSCOPIC CHOLECYSTECTOMY;  Surgeon: Ricarda Frameharles Woodham, MD;  Location: [facility] ORS;  Service: General;  Laterality: N/A;  . HERNIA REPAIR  2002  . NASAL SINUS SURGERY    . WRIST SURGERY      FAMILY HISTORY: Family History  Problem Relation Age of Onset  . Thyroid disease Mother   . Hypertension Father   . Hemochromatosis Father     ADVANCED DIRECTIVES (Y/N):  N  HEALTH MAINTENANCE: Social History  Substance Use Topics  . Smoking status: Current Every Day Smoker    Packs/day: 1.00    Years: 15.00    Types: Cigarettes    Start date: 10/11/2014  . Smokeless tobacco: Never Used     Comment: nicotine  . Alcohol use No     Comment: in recovery for 3.5 years; last used ETOH 1 month ago     Colonoscopy:  PAP:  Bone density:  Lipid panel:  Allergies  Allergen Reactions  . Prednisone Other (See Comments)     Makes her moody Other reaction(s): Other (See Comments) Makes her moody    Current Outpatient Prescriptions  Medication Sig Dispense Refill  . buPROPion (WELLBUTRIN SR) 200 MG 12 hr tablet Take 200 mg by mouth daily.    Marland Kitchen. dicyclomine (BENTYL) 10 MG capsule Take 1 capsule (10 mg total) by mouth 3 (three) times daily as needed for spasms. 16 capsule 0  . dicyclomine (BENTYL) 10 MG capsule Take 1 capsule (10 mg total) by mouth 3 (three) times daily as needed for spasms. 16 capsule 0  . FLUoxetine (PROZAC) 20 MG capsule Take 20 mg by  mouth daily.     Marland Kitchen HYDROcodone-acetaminophen (NORCO/VICODIN) 5-325 MG tablet Take 1-2 tablets by mouth every 4 (four) hours as needed for moderate pain or severe pain. 30 tablet 0  . ibuprofen (ADVIL,MOTRIN) 600 MG tablet Take 1 tablet (600 mg total) by mouth every 6 (six) hours as needed. 30 tablet 0  . methylphenidate (METADATE CD) 20 MG CR capsule Take 20 mg by mouth 2 (two) times daily.    Marland Kitchen nystatin (MYCOSTATIN) 100000 UNIT/ML suspension Take 5 mLs (500,000 Units total) by mouth 4 (four) times  daily. 120 mL 0  . ondansetron (ZOFRAN) 4 MG tablet Take 1 tablet (4 mg total) by mouth every 8 (eight) hours as needed for nausea or vomiting. (Patient not taking: Reported on 12/12/2016) 21 tablet 0  . polyethylene glycol (MIRALAX / GLYCOLAX) packet Take 17 g by mouth daily. Mix one tablespoon with 8oz of your favorite juice or water every day until you are having soft formed stools. Then start taking once daily if you didn't have a stool the day before. 30 each 0  . polyethylene glycol (MIRALAX / GLYCOLAX) packet Take 17 g by mouth daily. Mix one tablespoon with 8oz of your favorite juice or water every day until you are having soft formed stools. Then start taking once daily if you didn't have a stool the day before. 30 each 0   No current facility-administered medications for this visit.     OBJECTIVE: There were no vitals filed for this visit.   There is no height or weight on file to calculate BMI.    ECOG FS:0 - Asymptomatic  General: Well-developed, well-nourished, no acute distress. Eyes: Pink conjunctiva, anicteric sclera. HEENT: Normocephalic, moist mucous membranes, clear oropharnyx. Lungs: Clear to auscultation bilaterally. Heart: Regular rate and rhythm. No rubs, murmurs, or gallops. Abdomen: Soft, nontender, nondistended. No organomegaly noted, normoactive bowel sounds. Musculoskeletal: No edema, cyanosis, or clubbing. Neuro: Alert, answering all questions appropriately. Cranial nerves grossly intact. Skin: No rashes or petechiae noted. Psych: Normal affect. Lymphatics: No cervical, calvicular, axillary or inguinal LAD.   LAB RESULTS:  Lab Results  Component Value Date   NA 134 (L) 04/27/2017   K 3.6 04/27/2017   CL 103 04/27/2017   CO2 24 04/27/2017   GLUCOSE 85 04/27/2017   BUN 11 04/27/2017   CREATININE 0.86 04/27/2017   CALCIUM 9.2 04/27/2017   PROT 7.7 04/27/2017   ALBUMIN 4.2 04/27/2017   AST 24 04/27/2017   ALT 31 04/27/2017   ALKPHOS 68 04/27/2017    BILITOT 0.8 04/27/2017   GFRNONAA >60 04/27/2017   GFRAA >60 04/27/2017    Lab Results  Component Value Date   WBC 5.7 05/28/2017   NEUTROABS 4.3 12/11/2016   HGB 14.8 05/28/2017   HCT 42.9 05/28/2017   MCV 91.6 05/28/2017   PLT 275 05/28/2017   Lab Results  Component Value Date   FERRITIN 80 05/28/2017   Lab Results  Component Value Date   IRON 136 05/28/2017   TIBC 353 05/28/2017   IRONPCTSAT 39 (H) 05/28/2017     STUDIES: No results found.  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  PLAN:    1.  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 Her hemoglobin is within normal limits. She has a mildly elevated serum iron saturation, but the remainder of her laboratory work is either negative or within normal limits. No intervention  is needed at this time. Patient does not require phlebotomy. Most patients who are heterozygote do not require phlebotomy. Return to clinic in 6 months with repeat laboratory work and further evaluation.  Approximately 45 minutes was spent in discussion of which greater than 50% was consultation.  Patient expressed understanding and was in agreement with this plan. She also understands that She can call clinic at any time with any questions, concerns, or complaints.    Jeralyn Ruths, MD   06/03/2017 9:07 AM

## 2017-06-11 ENCOUNTER — Telehealth: Payer: Self-pay | Admitting: *Deleted

## 2017-06-11 NOTE — Telephone Encounter (Signed)
Asking to be called with her lab results. [phone]

## 2017-06-11 NOTE — Telephone Encounter (Signed)
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  Nothing to do. Keep f/u as scheduled.

## 2017-06-12 NOTE — Telephone Encounter (Signed)
Patient advised of results and to fu as planned, she advised she has no appt message sent to scheduling to call her with appt in 6 months as per MD note

## 2017-06-25 ENCOUNTER — Encounter: Payer: Self-pay | Admitting: Emergency Medicine

## 2017-06-25 ENCOUNTER — Emergency Department
Admission: EM | Admit: 2017-06-25 | Discharge: 2017-06-25 | Disposition: A | Discharge status: Home / Self Care | Payer: Medicare Other | Attending: Emergency Medicine | Admitting: Emergency Medicine

## 2017-06-25 ENCOUNTER — Emergency Department: Payer: Medicare Other

## 2017-06-25 DIAGNOSIS — J45909 Unspecified asthma, uncomplicated: Secondary | ICD-10-CM | POA: Insufficient documentation

## 2017-06-25 DIAGNOSIS — Y939 Activity, unspecified: Secondary | ICD-10-CM | POA: Diagnosis not present

## 2017-06-25 DIAGNOSIS — Y929 Unspecified place or not applicable: Secondary | ICD-10-CM | POA: Diagnosis not present

## 2017-06-25 DIAGNOSIS — Z79899 Other long term (current) drug therapy: Secondary | ICD-10-CM | POA: Insufficient documentation

## 2017-06-25 DIAGNOSIS — Z23 Encounter for immunization: Secondary | ICD-10-CM | POA: Insufficient documentation

## 2017-06-25 DIAGNOSIS — F17210 Nicotine dependence, cigarettes, uncomplicated: Secondary | ICD-10-CM | POA: Diagnosis not present

## 2017-06-25 DIAGNOSIS — W19XXXA Unspecified fall, initial encounter: Secondary | ICD-10-CM | POA: Diagnosis not present

## 2017-06-25 DIAGNOSIS — Y999 Unspecified external cause status: Secondary | ICD-10-CM | POA: Diagnosis not present

## 2017-06-25 DIAGNOSIS — S6991XA Unspecified injury of right wrist, hand and finger(s), initial encounter: Secondary | ICD-10-CM | POA: Diagnosis present

## 2017-06-25 DIAGNOSIS — S60221A Contusion of right hand, initial encounter: Secondary | ICD-10-CM

## 2017-06-25 MED ORDER — TRAMADOL HCL 50 MG PO TABS: 50.0000 mg | ORAL_TABLET | Freq: Four times a day (QID) | ORAL | 0 refills | Status: DC | PRN

## 2017-06-25 MED ORDER — DOXYCYCLINE HYCLATE 100 MG PO CAPS: 100.0000 mg | ORAL_CAPSULE | Freq: Two times a day (BID) | ORAL | 0 refills | Status: AC

## 2017-06-25 MED ORDER — OXYCODONE-ACETAMINOPHEN 5-325 MG PO TABS
1.0000 | ORAL_TABLET | Freq: Once | ORAL | Status: AC
  Administered 2017-06-25: 1 via ORAL
  Filled 2017-06-25: qty 1

## 2017-06-25 MED ORDER — TETANUS-DIPHTH-ACELL PERTUSSIS 5-2.5-18.5 LF-MCG/0.5 IM SUSP
0.5000 mL | Freq: Once | INTRAMUSCULAR | Status: AC
  Administered 2017-06-25: 0.5 mL via INTRAMUSCULAR
  Filled 2017-06-25: qty 0.5

## 2017-06-25 NOTE — Discharge Instructions (Signed)
Take tramadol as prescribed and transitioned to over-the-counter NSAIDs as symptoms improve. Over-the-counter NSAIDs such as ibuprofen, Motrin or Aleve. The wrist splint is a resting splint to be used while pain and swelling resolve. The splint is to be used as needed. There is a referral to see Dr. Hyacinth MeekerMiller from orthopedics if he failure symptoms don't improve he will have to call to schedule an appointment.

## 2017-06-25 NOTE — ED Provider Notes (Signed)
[facility] Emergency Department Provider Note   ____________________________________________   I have reviewed the triage vital signs and the nursing notes.   HISTORY  Chief Complaint Hand Pain    HPI Shannon Campbell is a 41 y.o. female presents with right hand pain and swelling secondary to an injury she sustained and some type of fall she is uncertain of this past Friday.Patient reports being in on a 3 day drinking binge and is unsure of any  Any of the causes of her injuries. Patient reports inability to flex or extend her right fingers and all fingers feel numb. Patient is right-hand dominant. Patient has full range of motion of the wrist without pain. Patient also reports on the left forearm and hand several wounds and abrasions that she feels may be animal or insect bites and she is uncertain. All are painful and swollen to the touch. Patient denies fever, chills, headache, vision changes, chest pain, chest tightness, shortness of breath, abdominal pain, nausea and vomiting.  Past Medical History:  Diagnosis Date  . Anxiety   . Asthma   . Depression   . Fibromyalgia   . History of cardiac arrest   . Pap smear abnormality of cervix with LGSIL   . Substance abuse   . VAIN (vaginal intraepithelial neoplasia)     Patient Active Problem List   Diagnosis Date Noted  . Hereditary hemochromatosis (HCC) 06/03/2017  . Calculus of gallbladder with acute cholecystitis without obstruction 12/11/2016  . Acute cholecystitis 12/11/2016  . Uncomplicated alcohol dependence (HCC) 10/14/2016  . Cervical dysplasia 08/09/2016  . Chest pain 08/07/2016  . Chronic fatigue 11/01/2015  . Chronic pain of multiple joints 11/01/2015  . Elevated antinuclear antibody (ANA) level 11/01/2015  . PMS (premenstrual syndrome) 07/09/2015  . Tobacco abuse 07/09/2015  . Asthma without status asthmaticus 07/08/2015  . Chicken pox 07/08/2015  . Clinical depression 07/08/2015  .  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 Inflamed nasal mucosa 07/08/2015  . Avitaminosis D 07/08/2015    Past Surgical History:  Procedure Laterality Date  . ABDOMINAL HYSTERECTOMY  2013  . CESAREAN SECTION  1999  . CHOLECYSTECTOMY N/A 12/12/2016   Procedure: LAPAROSCOPIC CHOLECYSTECTOMY;  Surgeon: Ricarda Frame, MD;  Location: [facility] ORS;  Service: General;  Laterality: N/A;  . HERNIA REPAIR  2002  . NASAL SINUS SURGERY    . WRIST SURGERY      Prior to Admission medications   Medication Sig Start Date End Date Taking? Authorizing Provider  buPROPion (WELLBUTRIN SR) 200 MG 12 hr tablet Take 200 mg by mouth daily.    [provider]  dicyclomine (BENTYL) 10 MG capsule Take 1 capsule (10 mg total) by mouth 3 (three) times daily as needed for spasms. 04/27/17 05/11/17  Willy Eddy, MD  dicyclomine (BENTYL) 10 MG capsule Take 1 capsule (10 mg total) by mouth 3 (three) times daily as needed for spasms. 04/27/17 05/11/17  Willy Eddy, MD  doxycycline (VIBRAMYCIN) 100 MG capsule Take 1 capsule (100 mg total) by mouth 2 (two) times daily. 06/25/17 07/05/17  Jabar Krysiak M, PA-C  FLUoxetine (PROZAC) 20 MG capsule Take 20 mg by mouth daily.     [provider]  HYDROcodone-acetaminophen (NORCO/VICODIN) 5-325 MG tablet Take 1-2 tablets by mouth every 4 (four) hours as needed for moderate pain or severe pain. 12/13/16   Ricarda Frame, MD  ibuprofen (ADVIL,MOTRIN) 600 MG tablet Take 1 tablet (600 mg total) by mouth every 6 (six) hours as needed. 03/18/17  Triplett, Cari B, FNP  methylphenidate (METADATE CD) 20 MG CR capsule Take 20 mg by mouth 2 (two) times daily.    [provider]  nystatin (MYCOSTATIN) 100000 UNIT/ML suspension Take 5 mLs (500,000 Units total) by mouth 4 (four) times daily. 03/18/17   Triplett, Cari B, FNP  ondansetron (ZOFRAN) 4 MG tablet Take 1 tablet (4 mg total) by mouth every 8 (eight) hours as needed for nausea or vomiting. Patient not taking:  Reported on 12/12/2016 12/10/16   Jennye Moccasin, MD  polyethylene glycol [facility] / Ethelene Hal) packet Take 17 g by mouth daily. Mix one tablespoon with 8oz of your favorite juice or water every day until you are having soft formed stools. Then start taking once daily if you didn't have a stool the day before. 04/27/17   Willy Eddy, MD  polyethylene glycol [facility] / Ethelene Hal) packet Take 17 g by mouth daily. Mix one tablespoon with 8oz of your favorite juice or water every day until you are having soft formed stools. Then start taking once daily if you didn't have a stool the day before. 04/27/17   Willy Eddy, MD  traMADol (ULTRAM) 50 MG tablet Take 1 tablet (50 mg total) by mouth every 6 (six) hours as needed. 06/25/17 06/25/18  Bryan Omura M, PA-C    Allergies Prednisone  Family History  Problem Relation Age of Onset  . Thyroid disease Mother   . Hypertension Father   . Hemochromatosis Father     Social History Social History  Substance Use Topics  . Smoking status: Current Every Day Smoker    Packs/day: 1.00    Years: 15.00    Types: Cigarettes    Start date: 10/11/2014  . Smokeless tobacco: Never Used     Comment: nicotine  . Alcohol use No     Comment: in recovery for 3.5 years; last used ETOH 1 month ago    Review of Systems Constitutional: Negative for fever/chills Eyes: No visual changes. Cardiovascular: Denies chest pain. Respiratory: Denies cough Denies shortness of breath. Musculoskeletal: right hand pain and swelling with numbness along all fingers. Skin:Negative for rash. Abrasions and cuts on the left forearm and left hand. Neurological: Negative for headaches.  Negative focal weakness or numbness. Negative for loss of consciousness. Able to ambulate. ____________________________________________   PHYSICAL EXAM:  VITAL SIGNS: ED Triage Vitals  Enc Vitals Group     BP 06/25/17 1449 116/77     Pulse Rate 06/25/17 1449 (!) 114     Resp 06/25/17  1449 20     Temp 06/25/17 1449 98 F (36.7 C)     Temp Source 06/25/17 1449 Oral     SpO2 06/25/17 1449 100 %     Weight 06/25/17 1450 180 lb (81.6 kg)     Height 06/25/17 1450 5\' 3"  (1.6 m)     Head Circumference --      Peak Flow --      Pain Score 06/25/17 1448 10     Pain Loc --      Pain Edu? --      Excl. in GC? --     Constitutional: Alert and oriented. Well appearing and in no acute distress.  Head: Normocephalic and atraumatic. Eyes: Conjunctivae are normal. PERRL.  Cardiovascular: Normal rate, regular rhythm. Normal distal pulses. Respiratory: Normal respiratory effort.  Gastrointestinal: Soft and nontender. No distention. Musculoskeletal: right wrist range of motion intact in all planes without pain. Right hand with decreased sensation along the palm  and dorsum and fingertips. Patient is unable to flex extend all fingers, unable to oppose with her thumb and is unable to grip with the hand secondary to pain. Right hand is edematous throughout. Otherwise,nontender with normal range of motion in all extremities.  Neurologic: Normal speech and language. Skin:  Skin is warm, dry and intact. No rash noted. Small abrasion along the left forearm with scabbing, and small wound on the left web of the thumb with scabbing, erythema and swelling. Psychiatric: Mood and affect are normal.  ____________________________________________   LABS (all labs ordered are listed, but only abnormal results are displayed)  Labs Reviewed - No data to display ____________________________________________  EKG none ____________________________________________  RADIOLOGY DG right hand complete FINDINGS: There is no evidence of fracture or dislocation. There is no evidence of arthropathy or other focal bone abnormality. Soft tissues are unremarkable.  IMPRESSION: Negative. ____________________________________________   PROCEDURES  Procedure(s) performed: SPLINT APPLICATION Date/Time:  4:57 PM Authorized by: Clois Comberraci M Natanel Snavely Consent: Verbal consent obtained. Risks and benefits: risks, benefits and alternatives were discussed Consent given by: patient Splint applied by: Willy Pinkerton Location details: Right hand  Splint type: Right forearm and hand  Supplies used: Ortho-Glass and Ace wrap  Post-procedure: The splinted body part was neurovascularly unchanged following the procedure. Patient tolerance: Patient tolerated the procedure well with no immediate complications.      Critical Care performed: no ____________________________________________   INITIAL IMPRESSION / ASSESSMENT AND PLAN / ED COURSE  Pertinent labs & imaging results that were available during my care of the patient were reviewed by me and considered in my medical decision making (see chart for details).  Patient presented with right hand pain, swelling and numbness. Along with small wounds to the left forearm and hand with unknown injury. Patient provided a tetanus shot and will be prescribed doxycycline for antibody coverage for wounds secondary to unknown injury pattern and appearance of likely infection. Patient physical exam findings and imaging are reassuring of no acute fracture or neurovascular injury. Patient reported decreased symptoms following Percocet given during course of care in the emergency department. Patient will be given a short course of tramadol and encouraged to transition to OTC NSAIDs as symptoms improve. Patient was provided temporary splint for support and pain management. Patient was instructed this splint can be removed and utilize as needed. Patient patient provided a referral for orthopedics if symptoms persisted and was also advised to return to the emergency department for symptoms that change or worsen. Patient informed of clinical course, understand medical decision-making process, and agree with plan.     ____________________________________________   FINAL CLINICAL  IMPRESSION(S) / ED DIAGNOSES  Final diagnoses:  Contusion of right hand, initial encounter       NEW MEDICATIONS STARTED DURING THIS VISIT:  Discharge Medication List as of 06/25/2017  4:23 PM    START taking these medications   Details  doxycycline (VIBRAMYCIN) 100 MG capsule Take 1 capsule (100 mg total) by mouth 2 (two) times daily., Starting Mon 06/25/2017, Until Thu 07/05/2017, Print    traMADol (ULTRAM) 50 MG tablet Take 1 tablet (50 mg total) by mouth every 6 (six) hours as needed., Starting Mon 06/25/2017, Until Tue 06/25/2018, Print         Note:  This document was prepared using Dragon voice recognition software and may include unintentional dictation errors.    Clois ComberLittle, Calub Tarnow M, PA-C 06/25/17 1658    Jeanmarie PlantMcShane, James A, MD 06/25/17 [phone]

## 2017-06-25 NOTE — ED Triage Notes (Signed)
States she drank heavily over the weekend  Unsure if she fell   Having pain to right hand and ankle area  Also having pain to left hand  Unsure of injury or insect bite

## 2017-06-29 DIAGNOSIS — R200 Anesthesia of skin: Secondary | ICD-10-CM | POA: Insufficient documentation

## 2017-09-03 ENCOUNTER — Emergency Department: Payer: Medicare Other

## 2017-09-03 ENCOUNTER — Emergency Department
Admission: EM | Admit: 2017-09-03 | Discharge: 2017-09-03 | Disposition: A | Discharge status: Home / Self Care | Payer: Medicare Other | Attending: Emergency Medicine | Admitting: Emergency Medicine

## 2017-09-03 ENCOUNTER — Encounter: Payer: Self-pay | Admitting: *Deleted

## 2017-09-03 DIAGNOSIS — J45909 Unspecified asthma, uncomplicated: Secondary | ICD-10-CM | POA: Diagnosis not present

## 2017-09-03 DIAGNOSIS — F17210 Nicotine dependence, cigarettes, uncomplicated: Secondary | ICD-10-CM | POA: Diagnosis not present

## 2017-09-03 DIAGNOSIS — R079 Chest pain, unspecified: Secondary | ICD-10-CM | POA: Diagnosis present

## 2017-09-03 DIAGNOSIS — J209 Acute bronchitis, unspecified: Secondary | ICD-10-CM | POA: Insufficient documentation

## 2017-09-03 DIAGNOSIS — Z79899 Other long term (current) drug therapy: Secondary | ICD-10-CM | POA: Insufficient documentation

## 2017-09-03 DIAGNOSIS — R0789 Other chest pain: Secondary | ICD-10-CM | POA: Insufficient documentation

## 2017-09-03 DIAGNOSIS — J40 Bronchitis, not specified as acute or chronic: Secondary | ICD-10-CM

## 2017-09-03 LAB — URINALYSIS, COMPLETE (UACMP) WITH MICROSCOPIC
Bilirubin Urine: NEGATIVE
Glucose, UA: NEGATIVE mg/dL
Hgb urine dipstick: NEGATIVE
KETONES UR: NEGATIVE mg/dL
Leukocytes, UA: NEGATIVE
Nitrite: NEGATIVE
PROTEIN: NEGATIVE mg/dL
RBC / HPF: NONE SEEN RBC/hpf (ref 0–5)
Specific Gravity, Urine: 1.019 (ref 1.005–1.030)
pH: 6 (ref 5.0–8.0)

## 2017-09-03 LAB — COMPREHENSIVE METABOLIC PANEL
ALT: 31 U/L (ref 14–54)
ANION GAP: 7 (ref 5–15)
AST: 28 U/L (ref 15–41)
Albumin: 3.4 g/dL — ABNORMAL LOW (ref 3.5–5.0)
Alkaline Phosphatase: 76 U/L (ref 38–126)
BUN: 8 mg/dL (ref 6–20)
CHLORIDE: 106 mmol/L (ref 101–111)
CO2: 26 mmol/L (ref 22–32)
CREATININE: 0.76 mg/dL (ref 0.44–1.00)
Calcium: 8.7 mg/dL — ABNORMAL LOW (ref 8.9–10.3)
GFR calc non Af Amer: >60 mL/min (ref >60)
Glucose, Bld: 97 mg/dL (ref 65–99)
Potassium: 3.3 mmol/L — ABNORMAL LOW (ref 3.5–5.1)
SODIUM: 139 mmol/L (ref 135–145)
Total Bilirubin: 0.5 mg/dL (ref 0.3–1.2)
Total Protein: 6.6 g/dL (ref 6.5–8.1)

## 2017-09-03 LAB — CBC
HCT: 39.4 % (ref 35.0–47.0)
HEMOGLOBIN: 14 g/dL (ref 12.0–16.0)
MCH: 32.8 pg (ref 26.0–34.0)
MCHC: 35.5 g/dL (ref 32.0–36.0)
MCV: 92.4 fL (ref 80.0–100.0)
Platelets: 311 10*3/uL (ref 150–440)
RBC: 4.27 MIL/uL (ref 3.80–5.20)
RDW: 14 % (ref 11.5–14.5)
WBC: 5 10*3/uL (ref 3.6–11.0)

## 2017-09-03 LAB — LIPASE, BLOOD: LIPASE: 22 U/L (ref 11–51)

## 2017-09-03 MED ORDER — IOPAMIDOL (ISOVUE-370) INJECTION 76%
75.0000 mL | Freq: Once | INTRAVENOUS | Status: AC | PRN
  Administered 2017-09-03: 75 mL via INTRAVENOUS
  Filled 2017-09-03: qty 75

## 2017-09-03 MED ORDER — HYDROCODONE-ACETAMINOPHEN 5-325 MG PO TABS: 1.0000 | ORAL_TABLET | Freq: Four times a day (QID) | ORAL | 0 refills | Status: DC | PRN

## 2017-09-03 MED ORDER — AZITHROMYCIN 250 MG PO TABS: ORAL_TABLET | ORAL | 0 refills | Status: AC

## 2017-09-03 MED ORDER — KETOROLAC TROMETHAMINE 30 MG/ML IJ SOLN
15.0000 mg | Freq: Once | INTRAMUSCULAR | Status: AC
Start: 2017-09-03 — End: 2017-09-03
  Administered 2017-09-03: 15 mg via INTRAVENOUS
  Filled 2017-09-03: qty 1

## 2017-09-03 MED ORDER — LIDOCAINE 5 % EX PTCH
1.0000 | MEDICATED_PATCH | CUTANEOUS | Status: DC
  Administered 2017-09-03: 1 via TRANSDERMAL
  Filled 2017-09-03 (×2): qty 1

## 2017-09-03 NOTE — ED Provider Notes (Signed)
[facility] Emergency Department Provider Note  ____________________________________________   First MD Initiated Contact with Patient 09/03/17 2205     (approximate)  I have reviewed the triage vital signs and the nursing notes.   HISTORY  Chief Complaint Abdominal Pain   HPI Shannon Campbell is a 41 y.o. female who comes to the emergency department with 3-4 days of moderate to severe right lateral chest pain. The pain is worse with twisting or movement, worse with deep breath. She does have a past medical history of one kidney stone although this feels different. The pain does not radiate towards her groin and she has no hematuria. She also has a remote abdominal surgical history of cholecystectomy. She's had normal bowel movements and flatus. No fevers or chills. She has had no recent surgery or immobilization. She's had no leg swelling.   Past Medical History:  Diagnosis Date  . Anxiety   . Asthma   . Depression   . Fibromyalgia   . History of cardiac arrest   . Pap smear abnormality of cervix with LGSIL   . Substance abuse   . VAIN (vaginal intraepithelial neoplasia)     Patient Active Problem List   Diagnosis Date Noted  . Hereditary hemochromatosis (HCC) 06/03/2017  . Calculus of gallbladder with acute cholecystitis without obstruction 12/11/2016  . Acute cholecystitis 12/11/2016  . Uncomplicated alcohol dependence (HCC) 10/14/2016  . Cervical dysplasia 08/09/2016  . Chest pain 08/07/2016  . Chronic fatigue 11/01/2015  . Chronic pain of multiple joints 11/01/2015  . Elevated antinuclear antibody (ANA) level 11/01/2015  . PMS (premenstrual syndrome) 07/09/2015  . Tobacco abuse 07/09/2015  . Asthma without status asthmaticus 07/08/2015  . Chicken pox 07/08/2015  . Clinical depression 07/08/2015  . 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 Inflamed nasal mucosa 07/08/2015  . Avitaminosis D 07/08/2015    Past Surgical History:  Procedure  Laterality Date  . ABDOMINAL HYSTERECTOMY  2013  . CESAREAN SECTION  1999  . CHOLECYSTECTOMY N/A 12/12/2016   Procedure: LAPAROSCOPIC CHOLECYSTECTOMY;  Surgeon: Ricarda Frame, MD;  Location: [facility] ORS;  Service: General;  Laterality: N/A;  . HERNIA REPAIR  2002  . NASAL SINUS SURGERY    . WRIST SURGERY      Prior to Admission medications   Medication Sig Start Date End Date Taking? Authorizing Provider  azithromycin (ZITHROMAX Z-PAK) 250 MG tablet Take 2 tablets (500 mg) on  Day 1,  followed by 1 tablet (250 mg) once daily on Days 2 through 5. 09/03/17 09/08/17  Merrily Brittle, MD  buPROPion (WELLBUTRIN SR) 200 MG 12 hr tablet Take 200 mg by mouth daily.    [provider]  dicyclomine (BENTYL) 10 MG capsule Take 1 capsule (10 mg total) by mouth 3 (three) times daily as needed for spasms. 04/27/17 05/11/17  Willy Eddy, MD  dicyclomine (BENTYL) 10 MG capsule Take 1 capsule (10 mg total) by mouth 3 (three) times daily as needed for spasms. 04/27/17 05/11/17  Willy Eddy, MD  FLUoxetine (PROZAC) 20 MG capsule Take 20 mg by mouth daily.     [provider]  HYDROcodone-acetaminophen (NORCO) 5-325 MG tablet Take 1 tablet by mouth every 6 (six) hours as needed for severe pain. 09/03/17   Merrily Brittle, MD  ibuprofen (ADVIL,MOTRIN) 600 MG tablet Take 1 tablet (600 mg total) by mouth every 6 (six) hours as needed. 03/18/17   Triplett, Rulon Eisenmenger B, FNP  methylphenidate (METADATE CD) 20 MG CR capsule Take 20 mg by mouth  2 (two) times daily.    [provider]  nystatin (MYCOSTATIN) 100000 UNIT/ML suspension Take 5 mLs (500,000 Units total) by mouth 4 (four) times daily. 03/18/17   Triplett, Cari B, FNP  ondansetron (ZOFRAN) 4 MG tablet Take 1 tablet (4 mg total) by mouth every 8 (eight) hours as needed for nausea or vomiting. Patient not taking: Reported on 12/12/2016 12/10/16   Jennye MoccasinQuigley, Brian S, MD  polyethylene glycol [facility](MIRALAX / Ethelene HalGLYCOLAX) packet Take 17 g by mouth daily.  Mix one tablespoon with 8oz of your favorite juice or water every day until you are having soft formed stools. Then start taking once daily if you didn't have a stool the day before. 04/27/17   Willy Eddyobinson, Patrick, MD  polyethylene glycol [facility](MIRALAX / Ethelene HalGLYCOLAX) packet Take 17 g by mouth daily. Mix one tablespoon with 8oz of your favorite juice or water every day until you are having soft formed stools. Then start taking once daily if you didn't have a stool the day before. 04/27/17   Willy Eddyobinson, Patrick, MD  traMADol (ULTRAM) 50 MG tablet Take 1 tablet (50 mg total) by mouth every 6 (six) hours as needed. 06/25/17 06/25/18  Little, Traci M, PA-C    Allergies Prednisone  Family History  Problem Relation Age of Onset  . Thyroid disease Mother   . Hypertension Father   . Hemochromatosis Father     Social History Social History  Substance Use Topics  . Smoking status: Current Every Day Smoker    Packs/day: 1.00    Years: 15.00    Types: Cigarettes    Start date: 10/11/2014  . Smokeless tobacco: Never Used     Comment: nicotine  . Alcohol use No     Comment: in recovery for 3.5 years; last used ETOH 1 month ago    Review of Systems Constitutional: No fever/chills Eyes: No visual changes. ENT: No sore throat. Cardiovascular: positive chest pain. Respiratory: positive shortness of breath. Gastrointestinal: positive abdominal pain.  No nausea, no vomiting.  No diarrhea.  No constipation. Genitourinary: Negative for dysuria. Musculoskeletal: Negative for back pain. Skin: Negative for rash. Neurological: Negative for headaches, focal weakness or numbness.   ____________________________________________   PHYSICAL EXAM:  VITAL SIGNS: ED Triage Vitals  Enc Vitals Group     BP 09/03/17 2036 115/63     Pulse Rate 09/03/17 2036 78     Resp 09/03/17 2036 20     Temp 09/03/17 2036 99 F (37.2 C)     Temp Source 09/03/17 2036 Oral     SpO2 09/03/17 2036 99 %     Weight 09/03/17 2038 165 lb  (74.8 kg)     Height 09/03/17 2038 5\' 3"  (1.6 m)     Head Circumference --      Peak Flow --      Pain Score 09/03/17 2036 10     Pain Loc --      Pain Edu? --      Excl. in GC? --     Constitutional: alert and oriented 4 pleasant cooperative speaks in full clear sentences no diaphoresis Eyes: PERRL EOMI. Head: Atraumatic. Nose: No congestion/rhinnorhea. Mouth/Throat: No trismus Neck: No stridor.   Cardiovascular: Normal rate, regular rhythm. Grossly normal heart sounds.  Good peripheral circulation. Respiratory: Normal respiratory effort.  No retractions. Lungs CTAB and moving good airexquisitely tender over rib 10 on the right laterally Gastrointestinal: soft nondistended nontender no rebound or guarding no peritonitis Musculoskeletal: No lower extremity edema   Neurologic:  Normal speech and language. No gross focal neurologic deficits are appreciated. Skin:  Skin is warm, dry and intact. No rash noted. Psychiatric: Mood and affect are normal. Speech and behavior are normal.    ____________________________________________   DIFFERENTIAL includes but not limited to  pleurodynia, costochondritis, pulmonary embolism, pneumonia, renal colic ____________________________________________   LABS (all labs ordered are listed, but only abnormal results are displayed)  Labs Reviewed  COMPREHENSIVE METABOLIC PANEL - Abnormal; Notable for the following:       Result Value   Potassium 3.3 (*)    Calcium 8.7 (*)    Albumin 3.4 (*)    All other components within normal limits  URINALYSIS, COMPLETE (UACMP) WITH MICROSCOPIC - Abnormal; Notable for the following:    Color, Urine YELLOW (*)    APPearance HAZY (*)    Bacteria, UA RARE (*)    Squamous Epithelial / LPF 6-30 (*)    All other components within normal limits  LIPASE, BLOOD  CBC    urinalysis with no hematuria __________________________________________  EKG  ED ECG REPORT I, Merrily Brittle, the attending  physician, personally viewed and interpreted this ECG.  Date: 09/03/2017 EKG Time:  Rate: 72 Rhythm: normal sinus rhythm QRS Axis: normal Intervals: normal ST/T Wave abnormalities: normal Narrative Interpretation: no evidence of acute ischemia  ____________________________________________  RADIOLOGY  CT scan negative for pulmonary embolism ____________________________________________   PROCEDURES  Procedure(s) performed: no  Procedures  Critical Care performed: no  Observation: no ____________________________________________   INITIAL IMPRESSION / ASSESSMENT AND PLAN / ED COURSE  Pertinent labs & imaging results that were available during my care of the patient were reviewed by me and considered in my medical decision making (see chart for details).  I appreciate the nursing triage note noting flank pain and abdominal pain, however to me the patient clearly states the pain is along her ribs and is pleuritic. I am concerned she may have a pulmonary embolism so we'll obtain a CT now.    ----------------------------------------- 11:23 PM on 09/03/2017 -----------------------------------------  The patient's pain is somewhat improved. Fortunately her CT scan is negative for pulmonary embolism but it does show bronchitic changes. Treat her with azithromycin and primary care follow-up. She is discharged home in improved condition.  ____________________________________________   FINAL CLINICAL IMPRESSION(S) / ED DIAGNOSES  Final diagnoses:  Chest wall pain  Bronchitis      NEW MEDICATIONS STARTED DURING THIS VISIT:  New Prescriptions   AZITHROMYCIN (ZITHROMAX Z-PAK) 250 MG TABLET    Take 2 tablets (500 mg) on  Day 1,  followed by 1 tablet (250 mg) once daily on Days 2 through 5.   HYDROCODONE-ACETAMINOPHEN (NORCO) 5-325 MG TABLET    Take 1 tablet by mouth every 6 (six) hours as needed for severe pain.     Note:  This document was prepared using Dragon voice  recognition software and may include unintentional dictation errors.     Merrily Brittle, MD 09/03/17 2324

## 2017-09-03 NOTE — Discharge Instructions (Signed)
Please take all of your antibiotics as prescribed and follow up with her primary care physician as needed. Return to the emergency department sooner for any concerns.  It was a pleasure to take care of you today, and thank you for coming to our emergency department.  If you have any questions or concerns before leaving please ask the nurse to grab me and I'm more than happy to go through your aftercare instructions again.  If you were prescribed any opioid pain medication today such as Norco, Vicodin, Percocet, morphine, hydrocodone, or oxycodone please make sure you do not drive when you are taking this medication as it can alter your ability to drive safely.  If you have any concerns once you are home that you are not improving or are in fact getting worse before you can make it to your follow-up appointment, please do not hesitate to call 911 and come back for further evaluation.  Merrily BrittleNeil Meloni Hinz, MD  Results for orders placed or performed during the hospital encounter of 09/03/17  Lipase, blood  Result Value Ref Range   Lipase 22 11 - 51 U/L  Comprehensive metabolic panel  Result Value Ref Range   Sodium 139 135 - 145 mmol/L   Potassium 3.3 (L) 3.5 - 5.1 mmol/L   Chloride 106 101 - 111 mmol/L   CO2 26 22 - 32 mmol/L   Glucose, Bld 97 65 - 99 mg/dL   BUN 8 6 - 20 mg/dL   Creatinine, Ser 1.610.76 0.44 - 1.00 mg/dL   Calcium 8.7 (L) 8.9 - 10.3 mg/dL   Total Protein 6.6 6.5 - 8.1 g/dL   Albumin 3.4 (L) 3.5 - 5.0 g/dL   AST 28 15 - 41 U/L   ALT 31 14 - 54 U/L   Alkaline Phosphatase 76 38 - 126 U/L   Total Bilirubin 0.5 0.3 - 1.2 mg/dL   GFR calc non Af Amer >60 >60 mL/min   GFR calc Af Amer >60 >60 mL/min   Anion gap 7 5 - 15  CBC  Result Value Ref Range   WBC 5.0 3.6 - 11.0 K/uL   RBC 4.27 3.80 - 5.20 MIL/uL   Hemoglobin 14.0 12.0 - 16.0 g/dL   HCT 09.639.4 04.535.0 - 40.947.0 %   MCV 92.4 80.0 - 100.0 fL   MCH 32.8 26.0 - 34.0 pg   MCHC 35.5 32.0 - 36.0 g/dL   RDW 81.114.0 91.411.5 - 78.214.5 %   Platelets 311 150 - 440 K/uL  Urinalysis, Complete w Microscopic  Result Value Ref Range   Color, Urine YELLOW (A) YELLOW   APPearance HAZY (A) CLEAR   Specific Gravity, Urine 1.019 1.005 - 1.030   pH 6.0 5.0 - 8.0   Glucose, UA NEGATIVE NEGATIVE mg/dL   Hgb urine dipstick NEGATIVE NEGATIVE   Bilirubin Urine NEGATIVE NEGATIVE   Ketones, ur NEGATIVE NEGATIVE mg/dL   Protein, ur NEGATIVE NEGATIVE mg/dL   Nitrite NEGATIVE NEGATIVE   Leukocytes, UA NEGATIVE NEGATIVE   RBC / HPF NONE SEEN 0 - 5 RBC/hpf   WBC, UA 0-5 0 - 5 WBC/hpf   Bacteria, UA RARE (A) NONE SEEN   Squamous Epithelial / LPF 6-30 (A) NONE SEEN   Mucus PRESENT    Ct Angio Chest Pe W/cm &/or Wo Cm  Result Date: 09/03/2017 CLINICAL DATA:  Shortness of breath for 3 days. RIGHT upper quadrant pain. History of cholecystectomy. EXAM: CT ANGIOGRAPHY CHEST WITH CONTRAST TECHNIQUE: Multidetector CT imaging of the chest was performed  using the standard protocol during bolus administration of intravenous contrast. Multiplanar CT image reconstructions and MIPs were obtained to evaluate the vascular anatomy. CONTRAST:  75 cc Isovue 370 COMPARISON:  CT chest August 07, 2017 FINDINGS: CARDIOVASCULAR: Adequate contrast opacification of the pulmonary artery's. Main pulmonary artery is not enlarged. No pulmonary arterial filling defects to the level of the subsegmental branches. Heart size is normal, no right heart strain. No pericardial effusion. Thoracic aorta is normal course and caliber, unremarkable. MEDIASTINUM/NODES: No lymphadenopathy by CT size criteria. LUNGS/PLEURA: Tracheobronchial tree is patent, no pneumothorax. Mild bronchial wall thickening. No pleural effusions, focal consolidations, pulmonary nodules or masses. Resolution of peripheral ground-glass opacities seen on prior examination. Minimal dependent atelectasis. Stable 3 mm LEFT lung base sub solid pulmonary nodule, no indicated follow-up. UPPER ABDOMEN: Status post  cholecystectomy. Mild gas distended distal esophagus associated with reflux. Mild colonic diverticulosis partially characterized. MUSCULOSKELETAL: Visualized soft tissues and included osseous structures are nonacute. Mild degenerative change of the thoracic spine. Review of the MIP images confirms the above findings. IMPRESSION: 1. No acute pulmonary embolism. 2. Mild bronchial wall thickening seen with reactive airway disease and bronchitis without focal consolidation. Electronically Signed   By: Awilda Metro M.D.   On: 09/03/2017 23:17

## 2017-09-03 NOTE — ED Triage Notes (Signed)
Pt has right side flank pain.  Hx of kidney stones.  Pt reports n/v.  Pt reports difficulty urinating.  Pt alert.

## 2017-09-03 NOTE — ED Notes (Signed)
Epad not working at time of discharge; hard copy signed at this time.

## 2017-09-08 IMAGING — CT CT ANGIO CHEST
2 of 6 series · 17 of 46 positions shown · IV contrast (APPLIED)
Comparison: Chest radiograph performed 08/06/2016

CLINICAL DATA: Acute onset of sharp mid chest pain, radiating to
the back, with left arm pain. Shortness of breath and nausea.
Initial encounter.

EXAM:
CT ANGIOGRAPHY CHEST WITH CONTRAST
TECHNIQUE: Multidetector CT imaging of the chest was performed using the
standard protocol during bolus administration of intravenous
contrast. Multiplanar CT image reconstructions and MIPs were
obtained to evaluate the vascular anatomy.
CONTRAST:  75 mL of Isovue 370 IV contrast

[Series 5: thins · axial · 0.71mm/px · z∈[-304,-92]mm · 14 of 234 slices shown]
[im 11/234  lung]
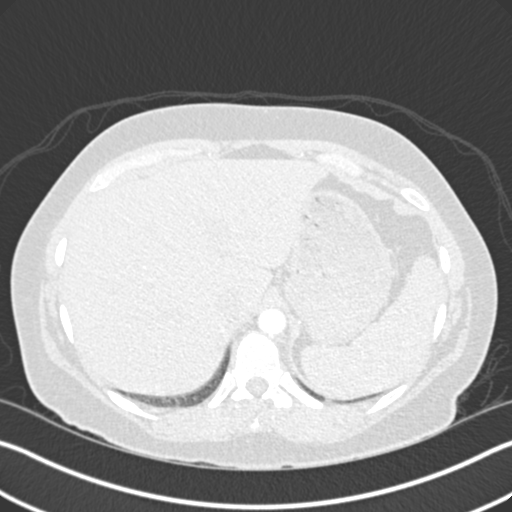
[im 31/234  soft-tissue]
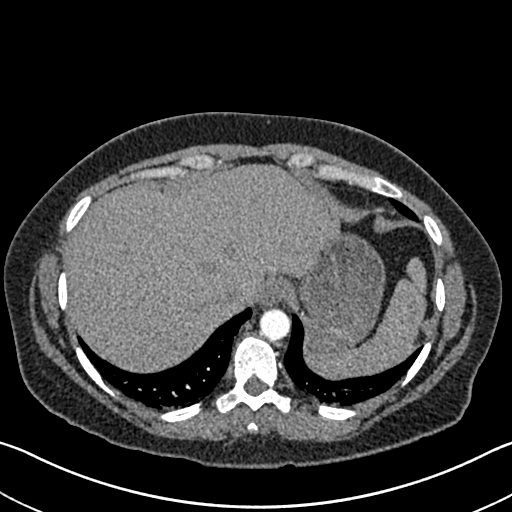
[im 41/234  lung]
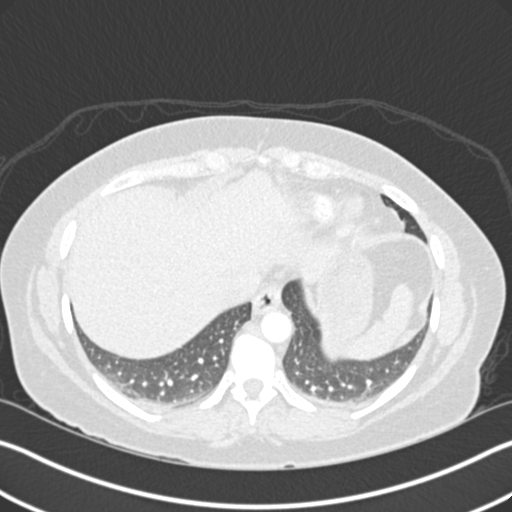
[im 61/234  soft-tissue]
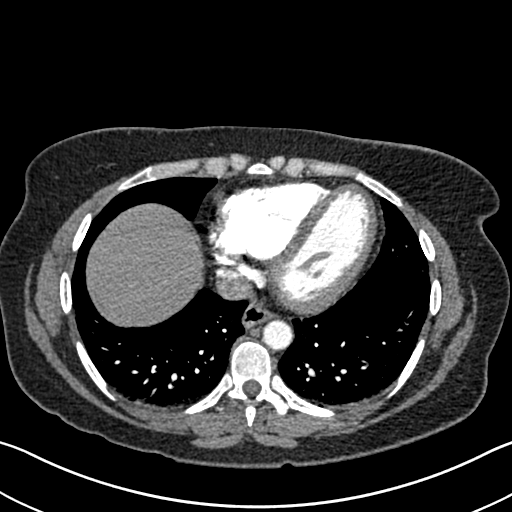
[im 82/234  lung]
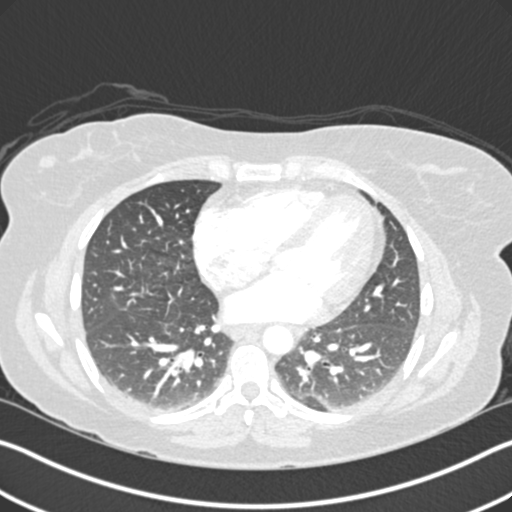
[im 92/234  soft-tissue]
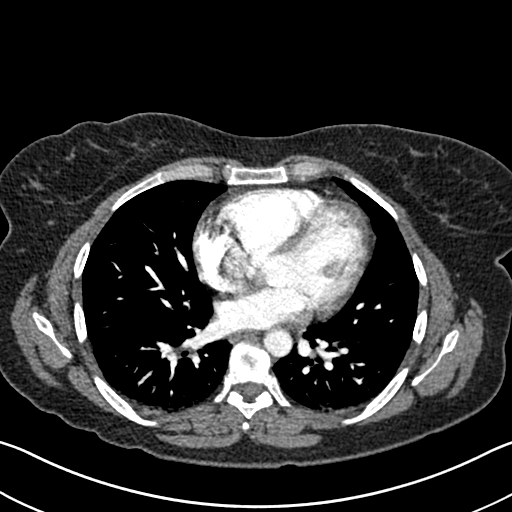
[im 112/234  lung]
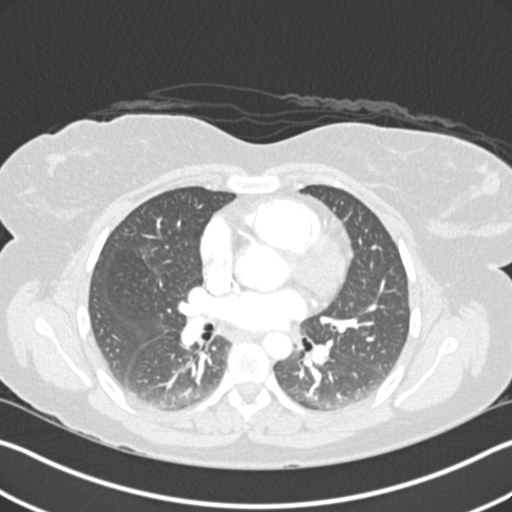
[im 122/234  soft-tissue]
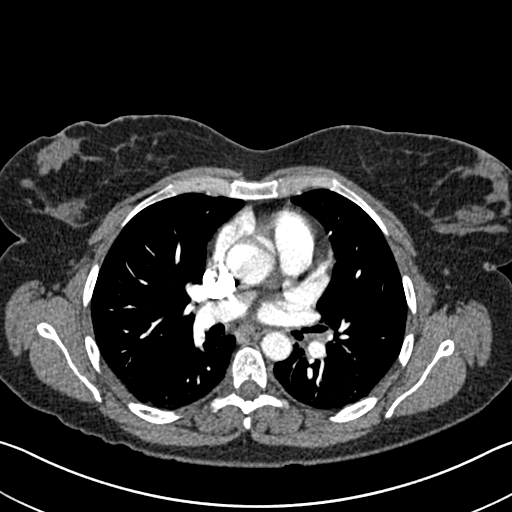
[im 142/234  lung]
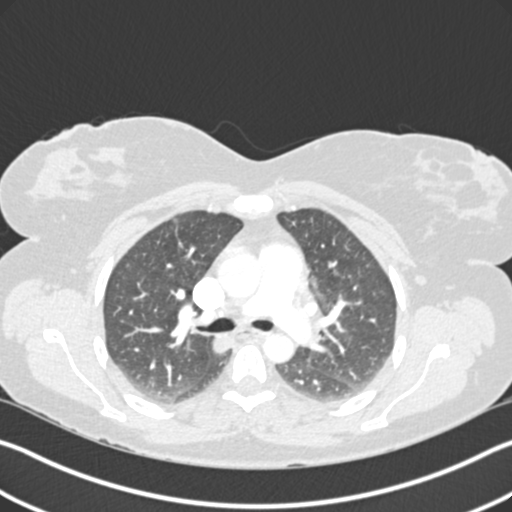
[im 152/234  soft-tissue]
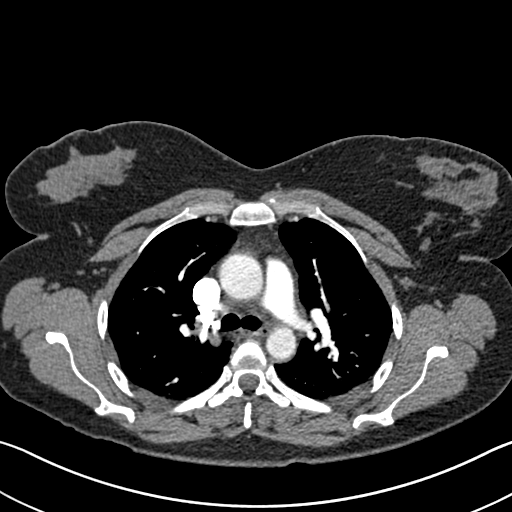
[im 173/234  lung]
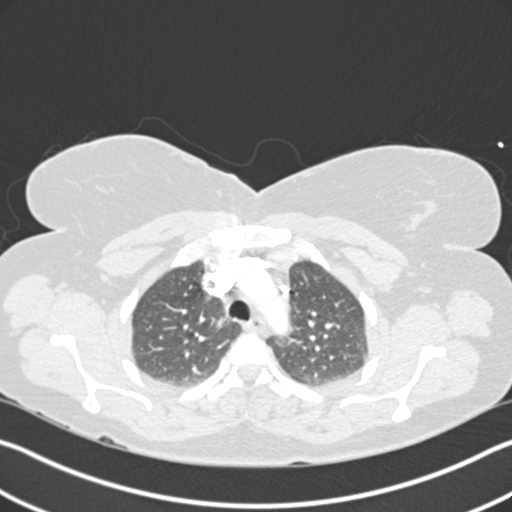
[im 193/234  soft-tissue]
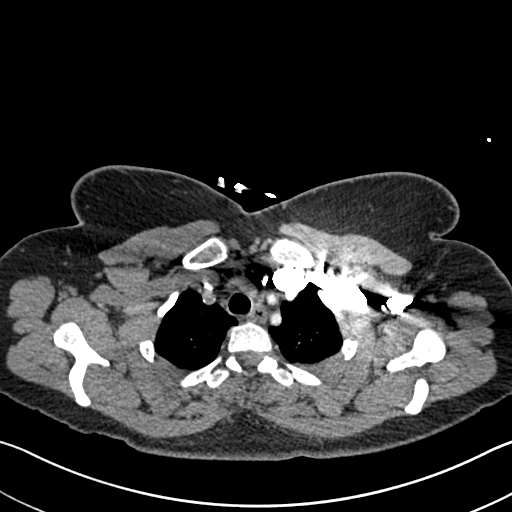
[im 203/234  lung]
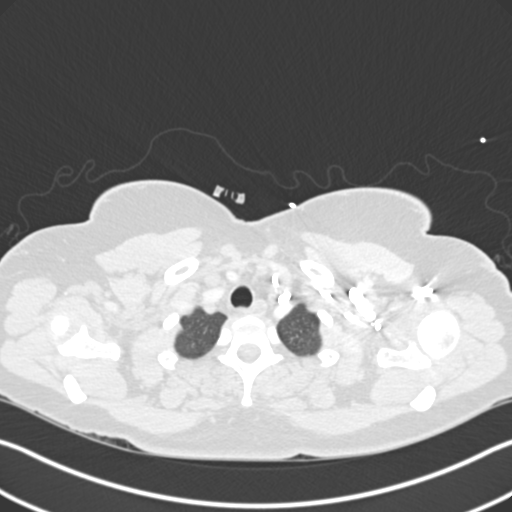
[im 223/234  soft-tissue]
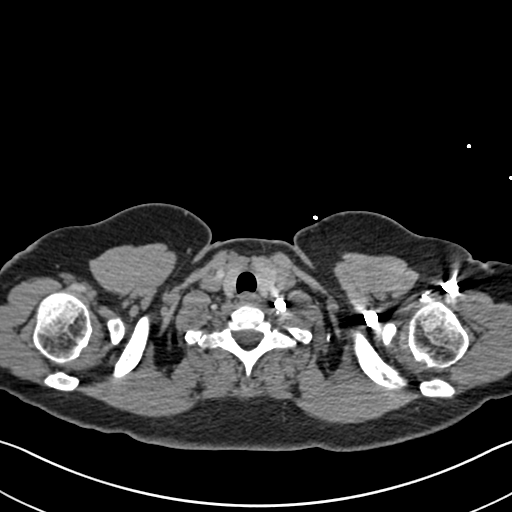

[Series 7: coronal mpr · coronal · 0.46mm/px · 3 of 126 slices shown]
[im 32/126  soft-tissue]
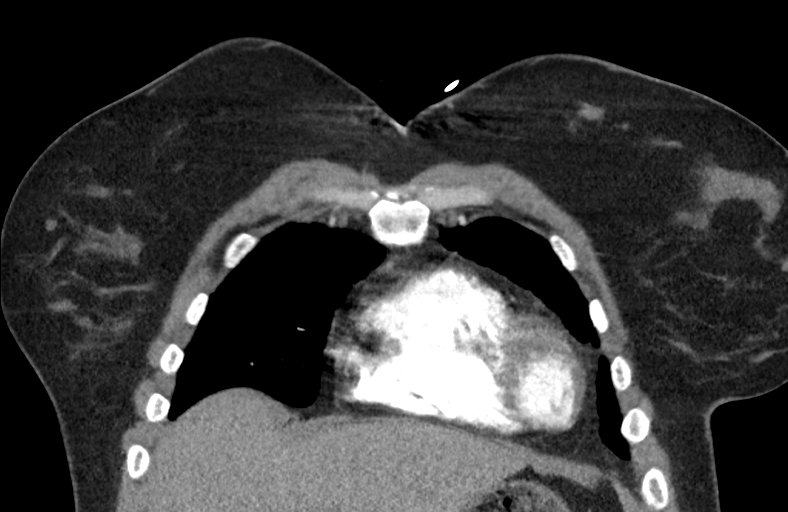
[im 63/126  soft-tissue]
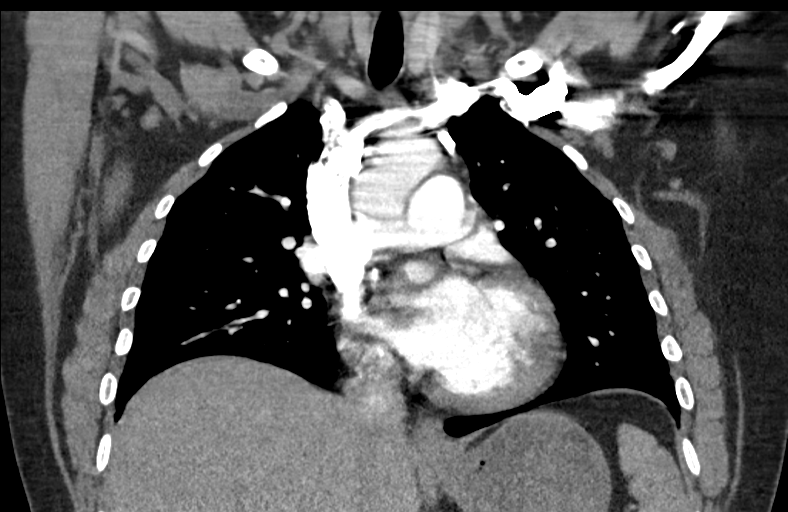
[im 94/126  soft-tissue]
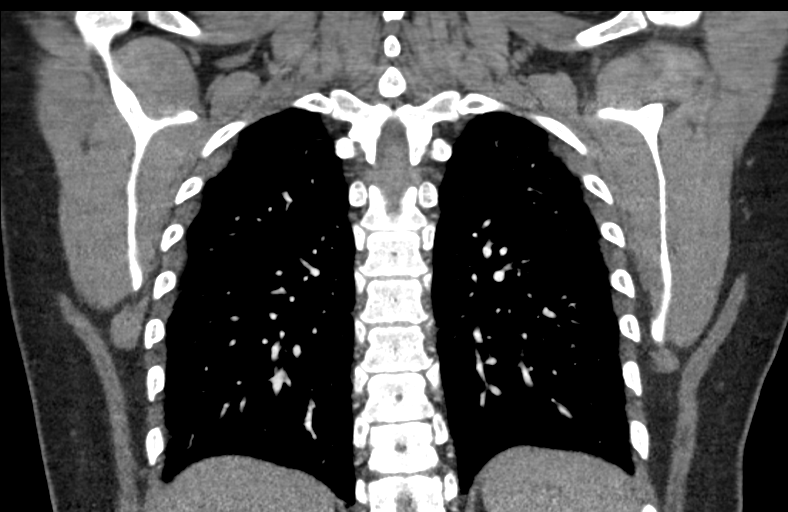

[17 of 46 positions shown; findings below may reference images not displayed]

FINDINGS: There is no evidence of pulmonary embolus.

Minimal bibasilar atelectasis is noted. Minimal scattered peripheral
opacities are seen bilaterally, raising concern for an acute
infectious process. There is no evidence of pleural effusion or
pneumothorax. No masses are identified; no abnormal focal contrast
enhancement is seen.

The mediastinum is unremarkable in appearance. No mediastinal
lymphadenopathy is seen. No pericardial effusion is identified. The
great vessels are grossly unremarkable in appearance. No axillary
lymphadenopathy is seen. The visualized portions of the thyroid
gland are unremarkable in appearance.

The visualized portions of the liver and spleen are unremarkable.

No acute osseous abnormalities are seen.

Review of the MIP images confirms the above findings.
IMPRESSION: 1. No evidence of pulmonary embolus.
2. Minimal scattered peripheral opacities seen bilaterally,
concerning for an acute infectious process.
3. Minimal bibasilar atelectasis noted.

## 2017-10-15 ENCOUNTER — Emergency Department
Admission: EM | Admit: 2017-10-15 | Discharge: 2017-10-15 | Disposition: A | Discharge status: Home / Self Care | Payer: Medicare Other | Attending: Emergency Medicine | Admitting: Emergency Medicine

## 2017-10-15 DIAGNOSIS — F1910 Other psychoactive substance abuse, uncomplicated: Secondary | ICD-10-CM | POA: Diagnosis not present

## 2017-10-15 DIAGNOSIS — Z79899 Other long term (current) drug therapy: Secondary | ICD-10-CM | POA: Insufficient documentation

## 2017-10-15 DIAGNOSIS — F17210 Nicotine dependence, cigarettes, uncomplicated: Secondary | ICD-10-CM | POA: Diagnosis not present

## 2017-10-15 DIAGNOSIS — J45909 Unspecified asthma, uncomplicated: Secondary | ICD-10-CM | POA: Insufficient documentation

## 2017-10-15 DIAGNOSIS — F329 Major depressive disorder, single episode, unspecified: Secondary | ICD-10-CM | POA: Diagnosis present

## 2017-10-15 LAB — URINE DRUG SCREEN, QUALITATIVE (ARMC ONLY)
Amphetamines, Ur Screen: POSITIVE — AB
Barbiturates, Ur Screen: NOT DETECTED
Benzodiazepine, Ur Scrn: NOT DETECTED
CANNABINOID 50 NG, UR ~~LOC~~: NOT DETECTED
Cocaine Metabolite,Ur ~~LOC~~: POSITIVE — AB
MDMA (Ecstasy)Ur Screen: NOT DETECTED
Methadone Scn, Ur: NOT DETECTED
OPIATE, UR SCREEN: NOT DETECTED
Phencyclidine (PCP) Ur S: NOT DETECTED
TRICYCLIC, UR SCREEN: NOT DETECTED

## 2017-10-15 LAB — COMPREHENSIVE METABOLIC PANEL
ALT: 33 U/L (ref 14–54)
AST: 29 U/L (ref 15–41)
Albumin: 4.6 g/dL (ref 3.5–5.0)
Alkaline Phosphatase: 91 U/L (ref 38–126)
Anion gap: 10 (ref 5–15)
BUN: 8 mg/dL (ref 6–20)
CALCIUM: 9 mg/dL (ref 8.9–10.3)
CHLORIDE: 103 mmol/L (ref 101–111)
CO2: 23 mmol/L (ref 22–32)
CREATININE: 0.82 mg/dL (ref 0.44–1.00)
Glucose, Bld: 103 mg/dL — ABNORMAL HIGH (ref 65–99)
Potassium: 3.7 mmol/L (ref 3.5–5.1)
Sodium: 136 mmol/L (ref 135–145)
Total Bilirubin: 1 mg/dL (ref 0.3–1.2)
Total Protein: 8.5 g/dL — ABNORMAL HIGH (ref 6.5–8.1)

## 2017-10-15 LAB — CBC
HCT: 47.8 % — ABNORMAL HIGH (ref 35.0–47.0)
Hemoglobin: 16.2 g/dL — ABNORMAL HIGH (ref 12.0–16.0)
MCH: 31.1 pg (ref 26.0–34.0)
MCHC: 33.9 g/dL (ref 32.0–36.0)
MCV: 91.7 fL (ref 80.0–100.0)
PLATELETS: 318 10*3/uL (ref 150–440)
RBC: 5.21 MIL/uL — AB (ref 3.80–5.20)
RDW: 12.4 % (ref 11.5–14.5)
WBC: 10 10*3/uL (ref 3.6–11.0)

## 2017-10-15 LAB — ETHANOL: ALCOHOL ETHYL (B): 97 mg/dL — AB (ref <10)

## 2017-10-15 LAB — ACETAMINOPHEN LEVEL: Acetaminophen (Tylenol), Serum: <10 ug/mL — ABNORMAL LOW (ref 10–30)

## 2017-10-15 LAB — POCT PREGNANCY, URINE: Preg Test, Ur: NEGATIVE

## 2017-10-15 LAB — SALICYLATE LEVEL

## 2017-10-15 MED ORDER — ONDANSETRON 4 MG PO TBDP
4.0000 mg | ORAL_TABLET | Freq: Once | ORAL | Status: AC
  Administered 2017-10-15: 4 mg via ORAL
  Filled 2017-10-15: qty 1

## 2017-10-15 NOTE — ED Notes (Signed)
Pt co nausea. Dr Manson PasseyBrown informed and new order received.

## 2017-10-15 NOTE — ED Notes (Signed)
BEHAVIORAL HEALTH ROUNDING  Patient sleeping: No.  Patient alert and oriented: yes  Behavior appropriate: Yes. ; If no, describe:  Nutrition and fluids offered: Yes  Toileting and hygiene offered: Yes  Sitter present: not applicable, Q 15 min safety rounds and observation.  Law enforcement present: Yes ODS  

## 2017-10-15 NOTE — Discharge Instructions (Signed)
Do not overuse alcohol or abuse illicit drugs.  Return to the emergency department if you develop severe pain, thoughts of hurting herself or anyone else, hallucinations, or any other symptoms concerning to you.
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
## 2017-10-15 NOTE — ED Notes (Signed)
Pt reports she started drinking about 2 months ago after approx 5 years of sobriety. Pt also admits to using crack cocaine last night. Pt states she does not wish to harm herself and that her son only told the police that so they would bring her to the ER to get help for her drinking and drug use. Per triage nurse son reported she told him she was going to do a bunch of drugs so she would die. Pt is calm and cooperative during her assessment.

## 2017-10-15 NOTE — ED Notes (Signed)
BEHAVIORAL HEALTH ROUNDING Patient sleeping: Yes.   Patient alert and oriented: not applicable SLEEPING Behavior appropriate: Yes.  ; If no, describe: SLEEPING Nutrition and fluids offered: No SLEEPING Toileting and hygiene offered: NoSLEEPING Sitter present: not applicable, Q 15 min safety rounds and observation. Law enforcement present: Yes ODS 

## 2017-10-15 NOTE — ED Notes (Signed)
Pt was dressed out by this EDT. Within patients belongings consisted of: 1 pair of black soft boots/ 1 pair of socks/ 1 camouflage pull over jacket/ 1 tee shirt/ 1 underwear/ 1 pants/ 1 hair tie (pink)/ 1 pair of clear stone earrings/ 1 silver colored stud earring. Rn notified. Urine has been sent to the lab, POCT was completed and Blood work was sent to the lab

## 2017-10-15 NOTE — ED Notes (Signed)

## 2017-10-15 NOTE — ED Notes (Signed)
SOC  DONE  REPORT  GIVIEN  TO DR  Sharma CovertNORMAN MD
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
## 2017-11-25 NOTE — Progress Notes (Signed)
[facility] @ [facility]  [phone] [phone]  ID: Shannon JacksonElizabeth Campbell OB: November 12, 1976  MR#: [id number]  AOZ#:[id number]#:[id number]  Patient Care Team: [facility], [facility] as PCP - General (Family Medicine)  CHIEF COMPLAINT: Hereditary hemochromatosis with single gene mutation at C282Y.  INTERVAL HISTORY: Patient returns to clinic today for repeat laboratory work and further evaluation. She currently feels well and is asymptomatic. She has no neurologic complaints. She denies any recent fevers. She has a good appetite and denies weight loss. She has no chest pain or shortness of breath. She denies any nausea, vomiting, constipation, or diarrhea. She has no urinary complaints. Patient feels at her baseline and offers no specific complaints today.  REVIEW OF SYSTEMS:   Review of Systems  Constitutional: Negative.  Negative for fever, malaise/fatigue and weight loss.  Respiratory: Negative.  Negative for cough and shortness of breath.   Cardiovascular: Negative.  Negative for chest pain and leg swelling.  Gastrointestinal: Negative.  Negative for abdominal pain.  Genitourinary: Negative.   Musculoskeletal: Negative.   Skin: Negative.  Negative for rash.  Neurological: Negative.  Negative for sensory change and weakness.  Psychiatric/Behavioral: Negative.  The patient is not nervous/anxious.     As per HPI. Otherwise, a complete review of systems is negative.  PAST MEDICAL HISTORY: Past Medical History:  Diagnosis Date  . Anxiety   . Asthma   . Depression   . Fibromyalgia   . History of cardiac arrest   . Pap smear abnormality of cervix with LGSIL   . Substance abuse (HCC)   . VAIN (vaginal intraepithelial neoplasia)     PAST SURGICAL HISTORY: Past Surgical History:  Procedure Laterality Date  . ABDOMINAL HYSTERECTOMY  2013  . CESAREAN SECTION  1999  . CHOLECYSTECTOMY N/A 12/12/2016   Procedure: LAPAROSCOPIC CHOLECYSTECTOMY;  Surgeon: Ricarda Frameharles Woodham, MD;   Location: [facility] ORS;  Service: General;  Laterality: N/A;  . HERNIA REPAIR  2002  . NASAL SINUS SURGERY    . WRIST SURGERY      FAMILY HISTORY: Family History  Problem Relation Age of Onset  . Thyroid disease Mother   . Hypertension Father   . Hemochromatosis Father     ADVANCED DIRECTIVES (Y/N):  N  HEALTH MAINTENANCE: Social History   Tobacco Use  . Smoking status: Current Every Day Smoker    Packs/day: 1.00    Years: 15.00    Pack years: 15.00    Types: Cigarettes    Start date: 10/11/2014  . Smokeless tobacco: Never Used  . Tobacco comment: nicotine  Substance Use Topics  . Alcohol use: No    Comment: in recovery for 3.5 years; last used ETOH 1 month ago  . Drug use: Yes    Types: "Crack" cocaine, Heroin, Cocaine    Comment: in recovery for 3.5 years; Last used drugs 1 month ago     Colonoscopy:  PAP:  Bone density:  Lipid panel:  Allergies  Allergen Reactions  . Prednisone Other (See Comments)     Makes her moody Other reaction(s): Other (See Comments) Makes her moody    Current Outpatient Medications  Medication Sig Dispense Refill  . buPROPion (WELLBUTRIN SR) 200 MG 12 hr tablet Take 200 mg by mouth daily.    Marland Kitchen. escitalopram (LEXAPRO) 10 MG tablet Take 15 mg by mouth daily.    Marland Kitchen. dicyclomine (BENTYL) 10 MG capsule Take 1 capsule (10 mg total) by mouth 3 (three) times daily as needed for spasms. (Patient not taking: Reported on 10/15/2017)  16 capsule 0  . dicyclomine (BENTYL) 10 MG capsule Take 1 capsule (10 mg total) by mouth 3 (three) times daily as needed for spasms. (Patient not taking: Reported on 10/15/2017) 16 capsule 0  . HYDROcodone-acetaminophen (NORCO) 5-325 MG tablet Take 1 tablet by mouth every 6 (six) hours as needed for severe pain. (Patient not taking: Reported on 10/15/2017) 5 tablet 0  . ibuprofen (ADVIL,MOTRIN) 600 MG tablet Take 1 tablet (600 mg total) by mouth every 6 (six) hours as needed. (Patient not taking: Reported on 10/15/2017)  30 tablet 0  . ibuprofen (ADVIL,MOTRIN) 800 MG tablet Take 800 mg by mouth 3 (three) times daily.    Marland Kitchen. nystatin (MYCOSTATIN) 100000 UNIT/ML suspension Take 5 mLs (500,000 Units total) by mouth 4 (four) times daily. (Patient not taking: Reported on 10/15/2017) 120 mL 0  . ondansetron (ZOFRAN) 4 MG tablet Take 1 tablet (4 mg total) by mouth every 8 (eight) hours as needed for nausea or vomiting. (Patient not taking: Reported on 12/12/2016) 21 tablet 0  . ondansetron (ZOFRAN-ODT) 4 MG disintegrating tablet Take 4 mg by mouth every 12 (twelve) hours as needed for nausea.    Marland Kitchen. oxyCODONE (OXY IR/ROXICODONE) 5 MG immediate release tablet Take 5 mg by mouth every 4 (four) hours as needed.    . phentermine (ADIPEX-P) 37.5 MG tablet Take 37.5 mg by mouth daily.  0  . polyethylene glycol (MIRALAX / GLYCOLAX) packet Take 17 g by mouth daily. Mix one tablespoon with 8oz of your favorite juice or water every day until you are having soft formed stools. Then start taking once daily if you didn't have a stool the day before. (Patient not taking: Reported on 10/15/2017) 30 each 0  . polyethylene glycol (MIRALAX / GLYCOLAX) packet Take 17 g by mouth daily. Mix one tablespoon with 8oz of your favorite juice or water every day until you are having soft formed stools. Then start taking once daily if you didn't have a stool the day before. (Patient not taking: Reported on 10/15/2017) 30 each 0  . traMADol (ULTRAM) 50 MG tablet Take 1 tablet (50 mg total) by mouth every 6 (six) hours as needed. (Patient not taking: Reported on 10/15/2017) 20 tablet 0   No current facility-administered medications for this visit.     OBJECTIVE: Vitals:   11/27/17 1216  BP: 117/77  Pulse: 88  Resp: 20  Temp: 97.9 F (36.6 C)     Body mass index is 31.12 kg/m.    ECOG FS:0 - Asymptomatic  General: Well-developed, well-nourished, no acute distress. Eyes: Pink conjunctiva, anicteric sclera. Lungs: Clear to auscultation  bilaterally. Heart: Regular rate and rhythm. No rubs, murmurs, or gallops. Abdomen: Soft, nontender, nondistended. No organomegaly noted, normoactive bowel sounds. Musculoskeletal: No edema, cyanosis, or clubbing. Neuro: Alert, answering all questions appropriately. Cranial nerves grossly intact. Skin: No rashes or petechiae noted. Psych: Normal affect.  LAB RESULTS:  Lab Results  Component Value Date   NA 136 10/15/2017   K 3.7 10/15/2017   CL 103 10/15/2017   CO2 23 10/15/2017   GLUCOSE 103 (H) 10/15/2017   BUN 8 10/15/2017   CREATININE 0.82 10/15/2017   CALCIUM 9.0 10/15/2017   PROT 8.5 (H) 10/15/2017   ALBUMIN 4.6 10/15/2017   AST 29 10/15/2017   ALT 33 10/15/2017   ALKPHOS 91 10/15/2017   BILITOT 1.0 10/15/2017   GFRNONAA >60 10/15/2017   GFRAA >60 10/15/2017    Lab Results  Component Value Date   WBC 4.9  11/27/2017   NEUTROABS 3.1 11/27/2017   HGB 14.8 11/27/2017   HCT 42.6 11/27/2017   MCV 93.2 11/27/2017   PLT 276 11/27/2017   Lab Results  Component Value Date   FERRITIN 80 05/28/2017   Lab Results  Component Value Date   IRON 136 05/28/2017   TIBC 353 05/28/2017   IRONPCTSAT 39 (H) 05/28/2017     STUDIES: No results found.  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  PLAN:    1.  Hereditary hemochromatosis with single gene mutation at C282Y: Blood work confirms the mutation which is identical to her father and son. Her hemoglobin continues to be within normal limits. She has a mildly elevated serum iron saturation, but the remainder of her laboratory work is either negative or within normal limits. No intervention is needed at this time. Patient does not require phlebotomy. Most patients who are heterozygote do not require phlebotomy.  After lengthy discussion with the patient, it was agreed upon that no further follow-up is necessary.  Please refer patient back if her hemoglobin or iron stores begin to trend up.     Approximately 20 minutes was spent in discussion of which greater than 50% was consultation.  Patient expressed understanding and was in agreement with this plan. She also understands that She can call clinic at any time with any questions, concerns, or complaints.    Jeralyn Ruths, MD   11/28/2017 1:32 PM

## 2017-11-27 ENCOUNTER — Encounter: Payer: Self-pay | Admitting: Oncology

## 2017-11-27 ENCOUNTER — Other Ambulatory Visit: Payer: Self-pay

## 2017-11-27 ENCOUNTER — Inpatient Hospital Stay: Payer: Medicare Other

## 2017-11-27 ENCOUNTER — Inpatient Hospital Stay: Payer: Medicare Other | Attending: Oncology | Admitting: Oncology

## 2017-11-27 DIAGNOSIS — Z8674 Personal history of sudden cardiac arrest: Secondary | ICD-10-CM | POA: Diagnosis not present

## 2017-11-27 DIAGNOSIS — F329 Major depressive disorder, single episode, unspecified: Secondary | ICD-10-CM | POA: Insufficient documentation

## 2017-11-27 DIAGNOSIS — F1110 Opioid abuse, uncomplicated: Secondary | ICD-10-CM

## 2017-11-27 DIAGNOSIS — F17210 Nicotine dependence, cigarettes, uncomplicated: Secondary | ICD-10-CM | POA: Diagnosis not present

## 2017-11-27 DIAGNOSIS — F1410 Cocaine abuse, uncomplicated: Secondary | ICD-10-CM | POA: Insufficient documentation

## 2017-11-27 DIAGNOSIS — R87612 Low grade squamous intraepithelial lesion on cytologic smear of cervix (LGSIL): Secondary | ICD-10-CM | POA: Insufficient documentation

## 2017-11-27 DIAGNOSIS — Z79899 Other long term (current) drug therapy: Secondary | ICD-10-CM

## 2017-11-27 DIAGNOSIS — M797 Fibromyalgia: Secondary | ICD-10-CM | POA: Insufficient documentation

## 2017-11-27 DIAGNOSIS — N893 Dysplasia of vagina, unspecified: Secondary | ICD-10-CM | POA: Insufficient documentation

## 2017-11-27 DIAGNOSIS — J45909 Unspecified asthma, uncomplicated: Secondary | ICD-10-CM | POA: Diagnosis not present

## 2017-11-27 DIAGNOSIS — F419 Anxiety disorder, unspecified: Secondary | ICD-10-CM | POA: Insufficient documentation

## 2017-11-27 LAB — CBC WITH DIFFERENTIAL/PLATELET
BASOS ABS: 0 10*3/uL (ref 0–0.1)
BASOS PCT: 1 %
EOS PCT: 1 %
Eosinophils Absolute: 0.1 10*3/uL (ref 0–0.7)
HCT: 42.6 % (ref 35.0–47.0)
Hemoglobin: 14.8 g/dL (ref 12.0–16.0)
LYMPHS PCT: 29 %
Lymphs Abs: 1.4 10*3/uL (ref 1.0–3.6)
MCH: 32.3 pg (ref 26.0–34.0)
MCHC: 34.7 g/dL (ref 32.0–36.0)
MCV: 93.2 fL (ref 80.0–100.0)
MONO ABS: 0.3 10*3/uL (ref 0.2–0.9)
Monocytes Relative: 6 %
Neutro Abs: 3.1 10*3/uL (ref 1.4–6.5)
Neutrophils Relative %: 63 %
PLATELETS: 276 10*3/uL (ref 150–440)
RBC: 4.57 MIL/uL (ref 3.80–5.20)
RDW: 13.9 % (ref 11.5–14.5)
WBC: 4.9 10*3/uL (ref 3.6–11.0)

## 2017-11-27 NOTE — Progress Notes (Signed)
Patient denies any concerns today.  

## 2018-01-03 DIAGNOSIS — F1910 Other psychoactive substance abuse, uncomplicated: Secondary | ICD-10-CM | POA: Insufficient documentation

## 2018-01-03 DIAGNOSIS — Z8674 Personal history of sudden cardiac arrest: Secondary | ICD-10-CM | POA: Insufficient documentation

## 2018-01-03 DIAGNOSIS — N893 Dysplasia of vagina, unspecified: Secondary | ICD-10-CM | POA: Insufficient documentation

## 2018-01-03 DIAGNOSIS — F411 Generalized anxiety disorder: Secondary | ICD-10-CM | POA: Insufficient documentation

## 2018-01-03 DIAGNOSIS — M797 Fibromyalgia: Secondary | ICD-10-CM | POA: Insufficient documentation

## 2018-01-29 DIAGNOSIS — Z8619 Personal history of other infectious and parasitic diseases: Secondary | ICD-10-CM | POA: Insufficient documentation

## 2018-01-29 DIAGNOSIS — E782 Mixed hyperlipidemia: Secondary | ICD-10-CM | POA: Insufficient documentation

## 2018-03-01 ENCOUNTER — Ambulatory Visit
Admission: EM | Admit: 2018-03-01 | Discharge: 2018-03-01 | Disposition: A | Discharge status: Home / Self Care | Payer: Medicare Other | Attending: Family Medicine | Admitting: Family Medicine

## 2018-03-01 ENCOUNTER — Other Ambulatory Visit: Payer: Self-pay

## 2018-03-01 ENCOUNTER — Encounter: Payer: Self-pay | Admitting: Emergency Medicine

## 2018-03-01 DIAGNOSIS — J029 Acute pharyngitis, unspecified: Secondary | ICD-10-CM | POA: Diagnosis not present

## 2018-03-01 DIAGNOSIS — H66003 Acute suppurative otitis media without spontaneous rupture of ear drum, bilateral: Secondary | ICD-10-CM

## 2018-03-01 DIAGNOSIS — J019 Acute sinusitis, unspecified: Secondary | ICD-10-CM | POA: Diagnosis not present

## 2018-03-01 LAB — RAPID STREP SCREEN (MED CTR MEBANE ONLY): STREPTOCOCCUS, GROUP A SCREEN (DIRECT): NEGATIVE

## 2018-03-01 MED ORDER — AZITHROMYCIN 250 MG PO TABS: ORAL_TABLET | ORAL | 0 refills | Status: DC

## 2018-03-01 MED ORDER — HYDROCORTISONE 1 % EX CREA
TOPICAL_CREAM | CUTANEOUS | Status: DC
Start: 2018-02-28 — End: 2018-03-01

## 2018-03-01 MED ORDER — BENZONATATE 100 MG PO CAPS: 100.0000 mg | ORAL_CAPSULE | Freq: Three times a day (TID) | ORAL | 0 refills | Status: DC

## 2018-03-01 MED ORDER — GENERIC EXTERNAL MEDICATION
Status: DC
Start: ? — End: 2018-03-01

## 2018-03-01 MED ORDER — CETIRIZINE HCL 10 MG PO TABS: 10.0000 mg | ORAL_TABLET | Freq: Every day | ORAL | 0 refills | Status: DC

## 2018-03-01 NOTE — Discharge Instructions (Signed)
-  azithromycin: Take 2 tablets by mouth the first day followed by one tablet daily for next 4 days. -Zyrtec: 1 tablet daily -Tessalon: 1 tablet every 8 hours as needed for cough -Fluids and rest -Rapid strep was negative, will send for culture. -Follow with primary care provider as needed.

## 2018-03-01 NOTE — ED Triage Notes (Signed)
Patient c/o sore throat, cough, sinus congestion and pressure for the past 3-4 days. Patient denies fevers.

## 2018-03-01 NOTE — ED Provider Notes (Signed)
[facility]    CSN: [id number] Arrival date & time: 03/01/18  1217     History   Chief Complaint Chief Complaint  Patient presents with  . Sore Throat  . Sinus Problem  . Cough    HPI Shannon Campbell is a 42 y.o. female.   Patient is a 42 year old female who presents with complaint of ear, throat, and sinus pain as well as feeling exhausted.  Patient states is also hard to swallow and reports that she has had some fluid leaking from both ears, left worse than right.  Patient denies fever but does report some chills.  Patient does report runny nose as well as a cough that was productive of thick yellow mucus this morning with a little tinge of blood.  Patient states her symptoms started about 3 days ago and has been taking Mucinex.  She has been around some people and kids recently who have had the flu.  Patient reports an allergy to prednisone, which causes some throat swelling and hives.  Patient denies any chest pain or shortness of breath.      Past Medical History:  Diagnosis Date  . Anxiety   . Asthma   . Depression   . Fibromyalgia   . History of cardiac arrest   . Pap smear abnormality of cervix with LGSIL   . Substance abuse (HCC)   . VAIN (vaginal intraepithelial neoplasia)     Patient Active Problem List   Diagnosis Date Noted  . Hereditary hemochromatosis (HCC) 06/03/2017  . Calculus of gallbladder with acute cholecystitis without obstruction 12/11/2016  . Acute cholecystitis 12/11/2016  . Uncomplicated alcohol dependence (HCC) 10/14/2016  . Cervical dysplasia 08/09/2016  . Chest pain 08/07/2016  . Chronic fatigue 11/01/2015  . Chronic pain of multiple joints 11/01/2015  . Elevated antinuclear antibody (ANA) level 11/01/2015  . PMS (premenstrual syndrome) 07/09/2015  . Tobacco abuse 07/09/2015  . Asthma without status asthmaticus 07/08/2015  . Chicken pox 07/08/2015  . Clinical depression 07/08/2015  . 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 Inflamed nasal mucosa 07/08/2015  . Avitaminosis D 07/08/2015    Past Surgical History:  Procedure Laterality Date  . ABDOMINAL HYSTERECTOMY  2013  . CESAREAN SECTION  1999  . CHOLECYSTECTOMY N/A 12/12/2016   Procedure: LAPAROSCOPIC CHOLECYSTECTOMY;  Surgeon: Ricarda Frameharles Woodham, MD;  Location: [facility] ORS;  Service: General;  Laterality: N/A;  . HERNIA REPAIR  2002  . NASAL SINUS SURGERY    . WRIST SURGERY      OB History    Gravida Para Term Preterm AB Living   1 1   1   2    SAB TAB Ectopic Multiple Live Births         1 2       Home Medications    Prior to Admission medications   Medication Sig Start Date End Date Taking? Authorizing Provider  lamoTRIgine (LAMICTAL) 25 MG tablet Take 25 mg by mouth daily.   Yes [provider]  polyethylene glycol (MIRALAX / GLYCOLAX) packet Take 17 g by mouth daily. Mix one tablespoon with 8oz of your favorite juice or water every day until you are having soft formed stools. Then start taking once daily if you didn't have a stool the day before. 04/27/17  Yes Willy Eddyobinson, Patrick, MD  azithromycin (ZITHROMAX Z-PAK) 250 MG tablet Take 2 tablets by mouth the first day followed by one tablet daily for next 4 days. 03/01/18   Candis SchatzHarris, Michael D, PA-C  cetirizine (ZYRTEC) 10 MG tablet Take 1 tablet (10 mg total) by mouth daily. 03/01/18   Candis Schatz, PA-C  ondansetron (ZOFRAN-ODT) 4 MG disintegrating tablet Take 4 mg by mouth every 12 (twelve) hours as needed for nausea. 10/05/17   [provider]    Family History Family History  Problem Relation Age of Onset  . Thyroid disease Mother   . Hypertension Father   . Hemochromatosis Father     Social History Social History   Tobacco Use  . Smoking status: Current Every Day Smoker    Packs/day: 1.00    Years: 15.00    Pack years: 15.00    Types: Cigarettes    Start date: 10/11/2014  . Smokeless tobacco: Never Used  . Tobacco comment: nicotine  Substance Use Topics  .  Alcohol use: No    Comment: in recovery for 3.5 years; last used ETOH 1 month ago  . Drug use: Yes    Types: "Crack" cocaine, Heroin, Cocaine    Comment: in recovery for 3.5 years; Last used drugs 1 month ago     Allergies   Prednisone   Review of Systems Review of Systems  As noted above HPI.  Other systems reviewed and found to be negative   Physical Exam Triage Vital Signs ED Triage Vitals  Enc Vitals Group     BP 03/01/18 1248 127/85     Pulse Rate 03/01/18 1248 97     Resp 03/01/18 1248 16     Temp 03/01/18 1248 98.4 F (36.9 C)     Temp Source 03/01/18 1248 Oral     SpO2 03/01/18 1248 100 %     Weight 03/01/18 1244 170 lb (77.1 kg)     Height 03/01/18 1244 5\' 3"  (1.6 m)     Head Circumference --      Peak Flow --      Pain Score 03/01/18 1244 8     Pain Loc --      Pain Edu? --      Excl. in GC? --    No data found.  Updated Vital Signs BP 127/85 (BP Location: Right Arm)   Pulse 97   Temp 98.4 F (36.9 C) (Oral)   Resp 16   Ht 5\' 3"  (1.6 m)   Wt 170 lb (77.1 kg)   LMP 12/26/2011 (LMP Unknown)   SpO2 100%   BMI 30.11 kg/m    Physical Exam  Constitutional: She is oriented to person, place, and time. She appears well-developed and well-nourished. She appears ill.  HENT:  Head: Normocephalic and atraumatic.  Right Ear: Ear canal normal. There is mastoid tenderness. Tympanic membrane is injected. Tympanic membrane is not perforated and not erythematous. A middle ear effusion is present.  Left Ear: Ear canal normal. There is mastoid tenderness. Tympanic membrane is injected. Tympanic membrane is not perforated and not erythematous. A middle ear effusion is present.  Nose: Right sinus exhibits maxillary sinus tenderness and frontal sinus tenderness. Left sinus exhibits maxillary sinus tenderness and frontal sinus tenderness.  Mouth/Throat: Uvula is midline. Posterior oropharyngeal erythema present.  Mild clear postnasal drainage  Eyes: EOM are normal.  Pupils are equal, round, and reactive to light.  Neck: Normal range of motion.  Cardiovascular: Normal rate, regular rhythm and normal heart sounds.  No murmur heard. Pulmonary/Chest: Effort normal and breath sounds normal. No respiratory distress. She has no wheezes.  Abdominal: Soft.  Musculoskeletal: Normal range of motion.  Lymphadenopathy:  She has cervical adenopathy.  Neurological: She is alert and oriented to person, place, and time. No cranial nerve deficit.  Skin: Skin is dry.  Psychiatric: She has a normal mood and affect. Her behavior is normal.     UC Treatments / Results  Labs (all labs ordered are listed, but only abnormal results are displayed) Labs Reviewed  RAPID STREP SCREEN (NOT AT [facility])  CULTURE, GROUP A STREP [facility])    EKG  EKG Interpretation None       Radiology No results found.  Procedures Procedures (including critical care time)  Medications Ordered in UC Medications - No data to display   Initial Impression / Assessment and Plan / UC Course  I have reviewed the triage vital signs and the nursing notes.  Pertinent labs & imaging results that were available during my care of the patient were reviewed by me and considered in my medical decision making (see chart for details).    Patient with complaint of ear pain, throat pain, sinus pressure with lymph node tenderness.  Patient also with reported chills.  Symptoms started about 3 days ago.  Patient with sinus tenderness and otitis media on exam.  We will give her prescription for azithromycin, Zyrtec, and Tessalon.  Have patient push fluids and rest.Follow with primary care provider as needed.  Final Clinical Impressions(s) / UC Diagnoses   Final diagnoses:  Acute sinusitis, recurrence not specified, unspecified location  Acute suppurative otitis media of both ears without spontaneous rupture of tympanic membranes, recurrence not specified  Sore throat    ED Discharge Orders         Ordered    azithromycin (ZITHROMAX Z-PAK) 250 MG tablet     03/01/18 1310    cetirizine (ZYRTEC) 10 MG tablet  Daily     03/01/18 1310       Controlled Substance Prescriptions [place] Controlled Substance Registry consulted? Not Applicable   Candis Schatz, PA-C 03/01/18 1313

## 2018-03-04 LAB — CULTURE, GROUP A STREP (THRC)

## 2018-03-11 ENCOUNTER — Other Ambulatory Visit: Payer: Self-pay | Admitting: Family Medicine

## 2018-03-11 DIAGNOSIS — Z1231 Encounter for screening mammogram for malignant neoplasm of breast: Secondary | ICD-10-CM

## 2018-03-20 DIAGNOSIS — F3181 Bipolar II disorder: Secondary | ICD-10-CM | POA: Insufficient documentation

## 2018-03-27 ENCOUNTER — Other Ambulatory Visit: Payer: Self-pay

## 2018-03-27 ENCOUNTER — Ambulatory Visit
Admission: EM | Admit: 2018-03-27 | Discharge: 2018-03-27 | Disposition: A | Discharge status: Home / Self Care | Payer: Medicare Other | Attending: Family Medicine | Admitting: Family Medicine

## 2018-03-27 DIAGNOSIS — J4521 Mild intermittent asthma with (acute) exacerbation: Secondary | ICD-10-CM

## 2018-03-27 DIAGNOSIS — J441 Chronic obstructive pulmonary disease with (acute) exacerbation: Secondary | ICD-10-CM

## 2018-03-27 MED ORDER — DOXYCYCLINE HYCLATE 100 MG PO TABS: 100.0000 mg | ORAL_TABLET | Freq: Two times a day (BID) | ORAL | 0 refills | Status: DC

## 2018-03-27 MED ORDER — ALBUTEROL SULFATE HFA 108 (90 BASE) MCG/ACT IN AERS: 1.0000 | INHALATION_SPRAY | Freq: Four times a day (QID) | RESPIRATORY_TRACT | 0 refills | Status: AC | PRN

## 2018-03-27 NOTE — ED Provider Notes (Signed)
[facility]    CSN: [id number] Arrival date & time: 03/27/18  1149     History   Chief Complaint Chief Complaint  Patient presents with  . Cough    APPT    HPI Shannon Campbell is a 42 y.o. female.   The history is provided by the patient.  Cough  Associated symptoms: wheezing   URI  Presenting symptoms: congestion, cough and fatigue   Onset quality:  Sudden Duration:  10 days Timing:  Constant Progression:  Worsening Chronicity:  New Relieved by:  Nothing Ineffective treatments:  OTC medications Associated symptoms: wheezing   Risk factors: chronic respiratory disease (asthma) and sick contacts     Past Medical History:  Diagnosis Date  . Anxiety   . Asthma   . Depression   . Fibromyalgia   . History of cardiac arrest   . Pap smear abnormality of cervix with LGSIL   . Substance abuse (HCC)   . VAIN (vaginal intraepithelial neoplasia)     Patient Active Problem List   Diagnosis Date Noted  . Hereditary hemochromatosis (HCC) 06/03/2017  . Calculus of gallbladder with acute cholecystitis without obstruction 12/11/2016  . Acute cholecystitis 12/11/2016  . Uncomplicated alcohol dependence (HCC) 10/14/2016  . Cervical dysplasia 08/09/2016  . Chest pain 08/07/2016  . Chronic fatigue 11/01/2015  . Chronic pain of multiple joints 11/01/2015  . Elevated antinuclear antibody (ANA) level 11/01/2015  . PMS (premenstrual syndrome) 07/09/2015  . Tobacco abuse 07/09/2015  . Asthma without status asthmaticus 07/08/2015  . Chicken pox 07/08/2015  . Clinical depression 07/08/2015  . 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 Inflamed nasal mucosa 07/08/2015  . Avitaminosis D 07/08/2015    Past Surgical History:  Procedure Laterality Date  . ABDOMINAL HYSTERECTOMY  2013  . CESAREAN SECTION  1999  . CHOLECYSTECTOMY N/A 12/12/2016   Procedure: LAPAROSCOPIC CHOLECYSTECTOMY;  Surgeon: Ricarda Frame, MD;  Location: [facility] ORS;  Service: General;  Laterality:  N/A;  . HERNIA REPAIR  2002  . NASAL SINUS SURGERY    . WRIST SURGERY      OB History    Gravida  1   Para  1   Term      Preterm  1   AB      Living  2     SAB      TAB      Ectopic      Multiple  1   Live Births  2            Home Medications    Prior to Admission medications   Medication Sig Start Date End Date Taking? Authorizing Provider  albuterol (PROVENTIL HFA;VENTOLIN HFA) 108 (90 Base) MCG/ACT inhaler Inhale 1-2 puffs into the lungs every 6 (six) hours as needed for wheezing or shortness of breath. 03/27/18   Payton Mccallum, MD  azithromycin (ZITHROMAX Z-PAK) 250 MG tablet Take 2 tablets by mouth the first day followed by one tablet daily for next 4 days. 03/01/18   Candis Schatz, PA-C  benzonatate (TESSALON) 100 MG capsule Take 1 capsule (100 mg total) by mouth every 8 (eight) hours. 03/01/18   Candis Schatz, PA-C  cetirizine (ZYRTEC) 10 MG tablet Take 1 tablet (10 mg total) by mouth daily. 03/01/18   Candis Schatz, PA-C  doxycycline (VIBRA-TABS) 100 MG tablet Take 1 tablet (100 mg total) by mouth 2 (two) times daily. 03/27/18   Payton Mccallum, MD  lamoTRIgine (LAMICTAL) 25 MG tablet Take 25 mg  by mouth daily.    [provider]  ondansetron (ZOFRAN-ODT) 4 MG disintegrating tablet Take 4 mg by mouth every 12 (twelve) hours as needed for nausea. 10/05/17   [provider]  polyethylene glycol (MIRALAX / GLYCOLAX) packet Take 17 g by mouth daily. Mix one tablespoon with 8oz of your favorite juice or water every day until you are having soft formed stools. Then start taking once daily if you didn't have a stool the day before. 04/27/17   Willy Eddy, MD    Family History Family History  Problem Relation Age of Onset  . Thyroid disease Mother   . Hypertension Father   . Hemochromatosis Father     Social History Social History   Tobacco Use  . Smoking status: Current Every Day Smoker    Packs/day: 1.00    Years: 15.00     Pack years: 15.00    Types: Cigarettes    Start date: 10/11/2014  . Smokeless tobacco: Never Used  . Tobacco comment: nicotine  Substance Use Topics  . Alcohol use: No    Comment: in recovery for 3.5 years; last used ETOH 1 month ago  . Drug use: Yes    Types: "Crack" cocaine, Heroin, Cocaine    Comment: in recovery for 3.5 years; Last used drugs 1 month ago     Allergies   Prednisone   Review of Systems Review of Systems  Constitutional: Positive for fatigue.  HENT: Positive for congestion.   Respiratory: Positive for cough and wheezing.      Physical Exam Triage Vital Signs ED Triage Vitals  Enc Vitals Group     BP 03/27/18 1159 118/87     Pulse Rate 03/27/18 1159 87     Resp 03/27/18 1159 17     Temp 03/27/18 1159 98.2 F (36.8 C)     Temp Source 03/27/18 1159 Oral     SpO2 03/27/18 1159 100 %     Weight 03/27/18 1157 175 lb (79.4 kg)     Height 03/27/18 1157 5\' 3"  (1.6 m)     Head Circumference --      Peak Flow --      Pain Score 03/27/18 1157 6     Pain Loc --      Pain Edu? --      Excl. in GC? --    No data found.  Updated Vital Signs BP 118/87 (BP Location: Right Arm)   Pulse 87   Temp 98.2 F (36.8 C) (Oral)   Resp 17   Ht 5\' 3"  (1.6 m)   Wt 175 lb (79.4 kg)   LMP 12/26/2011 (LMP Unknown)   SpO2 100%   BMI 31.00 kg/m   Visual Acuity Right Eye Distance:   Left Eye Distance:   Bilateral Distance:    Right Eye Near:   Left Eye Near:    Bilateral Near:     Physical Exam  Constitutional: She appears well-developed and well-nourished. No distress.  HENT:  Nose: Mucosal edema and rhinorrhea present. No nose lacerations, sinus tenderness, nasal deformity, septal deviation or nasal septal hematoma. No epistaxis.  No foreign bodies. Right sinus exhibits no maxillary sinus tenderness and no frontal sinus tenderness. Left sinus exhibits no maxillary sinus tenderness and no frontal sinus tenderness.  Mouth/Throat: Uvula is midline, oropharynx is  clear and moist and mucous membranes are normal. No oropharyngeal exudate.  Eyes: Conjunctivae are normal. Right eye exhibits no discharge. Left eye exhibits no discharge. No scleral icterus.  Neck: Normal range of motion. Neck supple. No thyromegaly present.  Cardiovascular: Normal rate, regular rhythm and normal heart sounds.  Pulmonary/Chest: Effort normal. No stridor. No respiratory distress. She has wheezes (rhonchi). She has no rales.  Lymphadenopathy:    She has no cervical adenopathy.  Skin: She is not diaphoretic.  Nursing note and vitals reviewed.    UC Treatments / Results  Labs (all labs ordered are listed, but only abnormal results are displayed) Labs Reviewed - No data to display  EKG None Radiology No results found.  Procedures Procedures (including critical care time)  Medications Ordered in UC Medications - No data to display   Initial Impression / Assessment and Plan / UC Course  I have reviewed the triage vital signs and the nursing notes.  Pertinent labs & imaging results that were available during my care of the patient were reviewed by me and considered in my medical decision making (see chart for details).       Final Clinical Impressions(s) / UC Diagnoses   Final diagnoses:  Mild intermittent asthmatic bronchitis with acute exacerbation    ED Discharge Orders        Ordered    doxycycline (VIBRA-TABS) 100 MG tablet  2 times daily     03/27/18 1241    albuterol (PROVENTIL HFA;VENTOLIN HFA) 108 (90 Base) MCG/ACT inhaler  Every 6 hours PRN     03/27/18 1241     1. diagnosis reviewed with patient 2. rx as per orders above; reviewed possible side effects, interactions, risks and benefits  3. Recommend supportive treatment with rest, fluids 4. Follow-up prn if symptoms worsen or don't improve  Controlled Substance Prescriptions [place] Controlled Substance Registry consulted? Not Applicable   Payton Mccallumonty, Lakeisa Heninger, MD 03/27/18 [phone]

## 2018-03-27 NOTE — ED Triage Notes (Signed)
Patient complains of cough and congestion that started over 1 week ago. Patient states that symptoms worsened yesterday with shortness of breath, fatigue and back pain.

## 2018-05-12 ENCOUNTER — Encounter: Payer: Self-pay | Admitting: Obstetrics and Gynecology

## 2018-06-10 ENCOUNTER — Encounter: Payer: Self-pay | Admitting: Obstetrics and Gynecology

## 2018-07-02 ENCOUNTER — Encounter: Payer: Self-pay | Admitting: Obstetrics and Gynecology

## 2018-08-21 ENCOUNTER — Ambulatory Visit: Payer: Medicare Other

## 2018-11-08 ENCOUNTER — Telehealth: Payer: Self-pay | Admitting: Obstetrics and Gynecology

## 2018-11-08 NOTE — Telephone Encounter (Signed)
Shanda BumpsJessica called from [facility] in [place] wanting to know if the patient's records could be resent; however, there were no other records to send.  Shanda BumpsJessica voiced understanding and thanked me for helping.  NO FURTHER ACTION REQUIRED AT THIS TIME

## 2018-11-19 ENCOUNTER — Encounter: Payer: Self-pay | Admitting: Obstetrics and Gynecology

## 2019-01-06 ENCOUNTER — Other Ambulatory Visit: Payer: Self-pay | Admitting: Nurse Practitioner

## 2019-01-06 DIAGNOSIS — Z1231 Encounter for screening mammogram for malignant neoplasm of breast: Secondary | ICD-10-CM

## 2019-01-13 ENCOUNTER — Encounter: Payer: Self-pay | Admitting: *Deleted

## 2019-01-20 ENCOUNTER — Ambulatory Visit: Payer: Medicare Other | Admitting: Gastroenterology

## 2019-01-21 ENCOUNTER — Ambulatory Visit
Admission: RE | Admit: 2019-01-21 | Discharge: 2019-01-21 | Disposition: A | Discharge status: Home / Self Care | Payer: Medicare Other | Source: Ambulatory Visit | Attending: Nurse Practitioner | Admitting: Nurse Practitioner

## 2019-01-21 DIAGNOSIS — Z1231 Encounter for screening mammogram for malignant neoplasm of breast: Secondary | ICD-10-CM | POA: Insufficient documentation

## 2019-02-17 ENCOUNTER — Other Ambulatory Visit: Payer: Self-pay

## 2019-02-18 ENCOUNTER — Encounter: Payer: Self-pay | Admitting: Gastroenterology

## 2019-02-18 ENCOUNTER — Ambulatory Visit (INDEPENDENT_AMBULATORY_CARE_PROVIDER_SITE_OTHER): Payer: Medicare Other | Admitting: Gastroenterology

## 2019-02-18 ENCOUNTER — Other Ambulatory Visit: Payer: Self-pay

## 2019-02-18 VITALS — BP 118/83 | HR 90 | Resp 17 | Ht 63.0 in | Wt 169.8 lb

## 2019-02-18 DIAGNOSIS — R140 Abdominal distension (gaseous): Secondary | ICD-10-CM

## 2019-02-18 DIAGNOSIS — K5909 Other constipation: Secondary | ICD-10-CM

## 2019-02-18 DIAGNOSIS — K219 Gastro-esophageal reflux disease without esophagitis: Secondary | ICD-10-CM | POA: Diagnosis not present

## 2019-02-18 DIAGNOSIS — R1033 Periumbilical pain: Secondary | ICD-10-CM | POA: Diagnosis not present

## 2019-02-18 DIAGNOSIS — K921 Melena: Secondary | ICD-10-CM

## 2019-02-18 DIAGNOSIS — F909 Attention-deficit hyperactivity disorder, unspecified type: Secondary | ICD-10-CM | POA: Insufficient documentation

## 2019-02-18 DIAGNOSIS — E538 Deficiency of other specified B group vitamins: Secondary | ICD-10-CM

## 2019-02-18 NOTE — Patient Instructions (Signed)

## 2019-02-18 NOTE — Progress Notes (Signed)
Arlyss Repress, MD [address]  [address]  [place], [place] [address]  Main: [phone]  Fax: [phone]    Gastroenterology Consultation  Referring Provider:     Fayrene Helper, NP Primary Care Physician:  Fayrene Helper, NP Primary Gastroenterologist:  Dr. Arlyss Repress Reason for Consultation:     Chronic abdominal pain, constipation        HPI:   Shannon Campbell is a 43 y.o. Caucasian female referred by Dr. Fayrene Helper, NP  for consultation & management of approximately 7 months history of central abdominal pain associated with constipation.  She also has history of GERD.  GERD: Heart burn, regurgitation for 1-2 years, tums helps but not PPI, symptoms are intermittent and often triggered by spicy foods. Symptoms do not recur as long as she avoids these foods.  She denies dysphagia  Abdominal pain and constipation: Has been experiencing central abdominal pain, mild to moderate in severity, worse when she is severely constipated, sometimes radiating to the lower back, along with severe constipation for last 7 months, bloating, hard stools, every 4days, stools are turning very dark lately, normal BMs prior to this.  She had a CT scan in 04/2017 which revealed normal pancreas, hepatobiliary anatomy Exercises regularly, on portion diet  She does smoke cigarettes, about 1 pack/day Had twin pregnancy, both vaginal delivery and C-section, prolonged labor Cholecystectomy 2 years ago She does have history of depression, anxiety and fibromyalgia. She has history of hereditary hemochromatosis, has not required therapeutic phlebotomy.  Ferritin and iron saturation at goal.  No evidence of chronic liver disease, LFTs have been normal She does have history of B12 deficiency for which she takes B12 injections monthly.  NSAIDs: None  Antiplts/Anticoagulants/Anti thrombotics: None  GI Procedures: None  Past Medical History:  Diagnosis Date  . Anxiety   . Asthma    . Depression   . Fibromyalgia   . History of cardiac arrest   . Pap smear abnormality of cervix with LGSIL   . Substance abuse (HCC)   . VAIN (vaginal intraepithelial neoplasia)     Past Surgical History:  Procedure Laterality Date  . ABDOMINAL HYSTERECTOMY  2013  . BREAST EXCISIONAL BIOPSY Left 2005   benign  . CESAREAN SECTION  1999  . CHOLECYSTECTOMY N/A 12/12/2016   Procedure: LAPAROSCOPIC CHOLECYSTECTOMY;  Surgeon: Ricarda Frame, MD;  Location: [facility] ORS;  Service: General;  Laterality: N/A;  . HERNIA REPAIR  2002  . NASAL SINUS SURGERY    . WRIST SURGERY     Current Outpatient Medications:  .  albuterol (PROVENTIL HFA;VENTOLIN HFA) 108 (90 Base) MCG/ACT inhaler, Inhale 1-2 puffs into the lungs every 6 (six) hours as needed for wheezing or shortness of breath., Disp: 1 Inhaler, Rfl: 0 .  EPINEPHrine 0.3 mg/0.3 mL IJ SOAJ injection, , Disp: , Rfl:  .  lamoTRIgine (LAMICTAL) 150 MG tablet, , Disp: , Rfl:  .  levocetirizine (XYZAL) 5 MG tablet, , Disp: , Rfl:  .  traZODone (DESYREL) 50 MG tablet, , Disp: , Rfl:  .  VYVANSE 60 MG capsule, TK ONE C PO  QD, Disp: , Rfl:    Family History  Problem Relation Age of Onset  . Thyroid disease Mother   . Hypertension Father   . Hemochromatosis Father   . Breast cancer Maternal Grandmother      Social History   Tobacco Use  . Smoking status: Current Every Day Smoker    Packs/day: 1.00  Years: 15.00    Pack years: 15.00    Types: Cigarettes    Start date: 10/11/2014  . Smokeless tobacco: Never Used  . Tobacco comment: nicotine  Substance Use Topics  . Alcohol use: No    Comment: in recovery for 3.5 years; last used ETOH 1 month ago  . Drug use: Yes    Types: "Crack" cocaine, Heroin, Cocaine    Comment: in recovery for 3.5 years; Last used drugs 1 month ago    Allergies as of 02/18/2019 - Review Complete 02/18/2019  Allergen Reaction Noted  . Prednisone Other (See Comments) 07/08/2015    Review of Systems:      All systems reviewed and negative except where noted in HPI.   Physical Exam:  BP 118/83 (BP Location: Left Arm, Patient Position: Sitting, Cuff Size: Large)   Pulse 90   Resp 17   Ht 5\' 3"  (1.6 m)   Wt 169 lb 12.8 oz (77 kg)   LMP 12/26/2011 (LMP Unknown)   BMI 30.08 kg/m  Patient's last menstrual period was 12/26/2011 (lmp unknown).  General:   Alert,  Well-developed, well-nourished, pleasant and cooperative in NAD Head:  Normocephalic and atraumatic. Eyes:  Sclera clear, no icterus.   Conjunctiva pink. Ears:  Normal auditory acuity. Nose:  No deformity, discharge, or lesions. Mouth:  No deformity or lesions,oropharynx pink & moist. Neck:  Supple; no masses or thyromegaly. Lungs:  Respirations even and unlabored.  Clear throughout to auscultation.   No wheezes, crackles, or rhonchi. No acute distress. Heart:  Regular rate and rhythm; no murmurs, clicks, rubs, or gallops. Abdomen:  Normal bowel sounds. Soft, non-tender and non-distended without masses, hepatosplenomegaly or hernias noted.  No guarding or rebound tenderness.   Rectal: Not performed Msk:  Symmetrical without gross deformities. Good, equal movement & strength bilaterally. Pulses:  Normal pulses noted. Extremities:  No clubbing or edema.  No cyanosis. Neurologic:  Alert and oriented x3;  grossly normal neurologically. Skin:  Intact without significant lesions or rashes. No jaundice. Psych:  Alert and cooperative. Normal mood and affect.  Imaging Studies: Reviewed  Assessment and Plan:   [name] is a 43 y.o. 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

## 2019-02-19 LAB — VITAMIN B12: Vitamin B-12: 381 pg/mL (ref 232–1245)

## 2019-02-19 LAB — CBC
HEMOGLOBIN: 14.6 g/dL (ref 11.1–15.9)
Hematocrit: 42.1 % (ref 34.0–46.6)
MCH: 31.3 pg (ref 26.6–33.0)
MCHC: 34.7 g/dL (ref 31.5–35.7)
MCV: 90 fL (ref 79–97)
Platelets: 221 10*3/uL (ref 150–450)
RBC: 4.67 x10E6/uL (ref 3.77–5.28)
RDW: 11.8 % (ref 11.7–15.4)
WBC: 3.5 10*3/uL (ref 3.4–10.8)

## 2019-02-19 LAB — TSH: TSH: 0.992 u[IU]/mL (ref 0.450–4.500)

## 2019-02-24 ENCOUNTER — Other Ambulatory Visit: Payer: Self-pay

## 2019-02-24 ENCOUNTER — Encounter: Payer: Self-pay | Admitting: *Deleted

## 2019-02-24 ENCOUNTER — Ambulatory Visit: Payer: Medicare Other | Admitting: Anesthesiology

## 2019-02-24 ENCOUNTER — Telehealth: Payer: Self-pay

## 2019-02-24 ENCOUNTER — Encounter
Admission: RE | Disposition: A | Discharge status: Home / Self Care | Payer: Self-pay | Source: Home / Self Care | Attending: Gastroenterology

## 2019-02-24 ENCOUNTER — Ambulatory Visit
Admission: RE | Admit: 2019-02-24 | Discharge: 2019-02-24 | Disposition: A | Discharge status: Home / Self Care | Payer: Medicare Other | Attending: Gastroenterology | Admitting: Gastroenterology

## 2019-02-24 DIAGNOSIS — K2960 Other gastritis without bleeding: Secondary | ICD-10-CM | POA: Insufficient documentation

## 2019-02-24 DIAGNOSIS — Z79899 Other long term (current) drug therapy: Secondary | ICD-10-CM | POA: Diagnosis not present

## 2019-02-24 DIAGNOSIS — R195 Other fecal abnormalities: Secondary | ICD-10-CM | POA: Insufficient documentation

## 2019-02-24 DIAGNOSIS — K921 Melena: Secondary | ICD-10-CM | POA: Diagnosis not present

## 2019-02-24 DIAGNOSIS — K219 Gastro-esophageal reflux disease without esophagitis: Secondary | ICD-10-CM | POA: Diagnosis not present

## 2019-02-24 DIAGNOSIS — F17210 Nicotine dependence, cigarettes, uncomplicated: Secondary | ICD-10-CM | POA: Diagnosis not present

## 2019-02-24 DIAGNOSIS — Z8674 Personal history of sudden cardiac arrest: Secondary | ICD-10-CM | POA: Insufficient documentation

## 2019-02-24 DIAGNOSIS — R1084 Generalized abdominal pain: Secondary | ICD-10-CM | POA: Diagnosis present

## 2019-02-24 DIAGNOSIS — B9681 Helicobacter pylori [H. pylori] as the cause of diseases classified elsewhere: Secondary | ICD-10-CM | POA: Insufficient documentation

## 2019-02-24 DIAGNOSIS — Z888 Allergy status to other drugs, medicaments and biological substances status: Secondary | ICD-10-CM | POA: Diagnosis not present

## 2019-02-24 DIAGNOSIS — K5909 Other constipation: Secondary | ICD-10-CM | POA: Diagnosis not present

## 2019-02-24 DIAGNOSIS — F329 Major depressive disorder, single episode, unspecified: Secondary | ICD-10-CM | POA: Insufficient documentation

## 2019-02-24 DIAGNOSIS — F419 Anxiety disorder, unspecified: Secondary | ICD-10-CM | POA: Diagnosis not present

## 2019-02-24 DIAGNOSIS — J45909 Unspecified asthma, uncomplicated: Secondary | ICD-10-CM | POA: Insufficient documentation

## 2019-02-24 HISTORY — PX: ESOPHAGOGASTRODUODENOSCOPY (EGD) WITH PROPOFOL: SHX5813

## 2019-02-24 HISTORY — PX: COLONOSCOPY WITH PROPOFOL: SHX5780

## 2019-02-24 LAB — URINE DRUG SCREEN, QUALITATIVE (ARMC ONLY)
Amphetamines, Ur Screen: POSITIVE — AB
Barbiturates, Ur Screen: NOT DETECTED
Benzodiazepine, Ur Scrn: NOT DETECTED
CANNABINOID 50 NG, UR ~~LOC~~: NOT DETECTED
Cocaine Metabolite,Ur ~~LOC~~: NOT DETECTED
MDMA (Ecstasy)Ur Screen: NOT DETECTED
Methadone Scn, Ur: NOT DETECTED
Opiate, Ur Screen: NOT DETECTED
Phencyclidine (PCP) Ur S: NOT DETECTED
Tricyclic, Ur Screen: NOT DETECTED

## 2019-02-24 SURGERY — ESOPHAGOGASTRODUODENOSCOPY (EGD) WITH PROPOFOL
Anesthesia: General

## 2019-02-24 MED ORDER — GLYCOPYRROLATE 0.2 MG/ML IJ SOLN
INTRAMUSCULAR | Status: AC
  Filled 2019-02-24: qty 1

## 2019-02-24 MED ORDER — LIDOCAINE HCL (PF) 2 % IJ SOLN
INTRAMUSCULAR | Status: AC
  Filled 2019-02-24: qty 10

## 2019-02-24 MED ORDER — GLYCOPYRROLATE 0.2 MG/ML IJ SOLN
INTRAMUSCULAR | Status: DC | PRN
  Administered 2019-02-24: 0.2 mg via INTRAVENOUS

## 2019-02-24 MED ORDER — PROPOFOL 500 MG/50ML IV EMUL
INTRAVENOUS | Status: DC | PRN
  Administered 2019-02-24: 150 ug/kg/min via INTRAVENOUS

## 2019-02-24 MED ORDER — PROPOFOL 10 MG/ML IV BOLUS
INTRAVENOUS | Status: DC | PRN
  Administered 2019-02-24: 100 mg via INTRAVENOUS

## 2019-02-24 MED ORDER — PROPOFOL 10 MG/ML IV BOLUS
INTRAVENOUS | Status: AC
  Filled 2019-02-24: qty 20

## 2019-02-24 MED ORDER — SODIUM CHLORIDE 0.9 % IV SOLN
INTRAVENOUS | Status: DC
  Administered 2019-02-24: 12:00:00 via INTRAVENOUS

## 2019-02-24 MED ORDER — LIDOCAINE 2% (20 MG/ML) 5 ML SYRINGE
INTRAMUSCULAR | Status: DC | PRN
  Administered 2019-02-24: 100 mg via INTRAVENOUS

## 2019-02-24 NOTE — Op Note (Signed)
[facility] Patient Name: Shannon Campbell Procedure Date: 02/24/2019 12:11 PM MRN: [id number] Account #: [id number] Date of Birth: 06-28-1976 Admit Type: Outpatient Age: 43 Room: [facility] ENDO ROOM 4 Gender: Female Note Status: Finalized Procedure:            Upper GI endoscopy Indications:          Generalized abdominal pain, dark stools Providers:            Lin Landsman MD, MD Referring MD:         Maryellen Pile (Referring MD) Medicines:            Monitored Anesthesia Care Complications:        No immediate complications. Estimated blood loss: None. Procedure:            Pre-Anesthesia Assessment:                       - Prior to the procedure, a History and Physical was                        performed, and patient medications and allergies were                        reviewed. The patient is competent. The risks and                        benefits of the procedure and the sedation options and                        risks were discussed with the patient. All questions                        were answered and informed consent was obtained.                        Patient identification and proposed procedure were                        verified by the physician, the nurse, the                        anesthesiologist, the anesthetist and the technician in                        the pre-procedure area in the procedure room in the                        endoscopy suite. Mental Status Examination: alert and                        oriented. Airway Examination: normal oropharyngeal                        airway and neck mobility. Respiratory Examination:                        clear to auscultation. CV Examination: normal.                        Prophylactic Antibiotics: The patient does not require  prophylactic antibiotics. Prior Anticoagulants: The                        patient has taken no previous anticoagulant or                    antiplatelet agents. ASA Grade Assessment: II - A                        patient with mild systemic disease. After reviewing the                        risks and benefits, the patient was deemed in                        satisfactory condition to undergo the procedure. The                        anesthesia plan was to use monitored anesthesia care                        (MAC). Immediately prior to administration of                        medications, the patient was re-assessed for adequacy                        to receive sedatives. The heart rate, respiratory rate,                        oxygen saturations, blood pressure, adequacy of                        pulmonary ventilation, and response to care were                        monitored throughout the procedure. The physical status                        of the patient was re-assessed after the procedure.                       After obtaining informed consent, the endoscope was                        passed under direct vision. Throughout the procedure,                        the patient's blood pressure, pulse, and oxygen                        saturations were monitored continuously. The Endoscope                        was introduced through the mouth, and advanced to the                        second part of duodenum. The upper GI endoscopy was  accomplished without difficulty. The patient tolerated                        the procedure well. Findings:      The duodenal bulb and second portion of the duodenum were normal.       Biopsies for histology were taken with a cold forceps for evaluation of       celiac disease.      The entire examined stomach was normal. Biopsies were taken with a cold       forceps for Helicobacter pylori testing.      The cardia and gastric fundus were normal on retroflexion.      The gastroesophageal junction and examined esophagus were normal. Impression:            - Normal duodenal bulb and second portion of the                        duodenum. Biopsied.                       - Normal stomach. Biopsied.                       - Normal gastroesophageal junction and esophagus. Recommendation:       - Await pathology results.                       - Proceed with colonoscopy as scheduled                       See colonoscopy report Procedure Code(s):    --- Professional ---                       [phone], Esophagogastroduodenoscopy, flexible, transoral;                        with biopsy, single or multiple Diagnosis Code(s):    --- Professional ---                       R10.84, Generalized abdominal pain CPT copyright 2018 American Medical Association. All rights reserved. The codes documented in this report are preliminary and upon coder review may  be revised to meet current compliance requirements. Dr. Ulyess Mort Lin Landsman MD, MD 02/24/2019 12:32:36 PM This report has been signed electronically. Number of Addenda: 0 Note Initiated On: 02/24/2019 12:11 PM      [facility]

## 2019-02-24 NOTE — Anesthesia Postprocedure Evaluation (Signed)
Anesthesia Post Note  Patient: Maneka Baltzell  Procedure(s) Performed: ESOPHAGOGASTRODUODENOSCOPY (EGD) WITH PROPOFOL (N/A ) COLONOSCOPY WITH PROPOFOL (N/A )  Patient location during evaluation: Endoscopy Anesthesia Type: General Level of consciousness: awake and alert Pain management: pain level controlled Vital Signs Assessment: post-procedure vital signs reviewed and stable Respiratory status: spontaneous breathing, nonlabored ventilation, respiratory function stable and patient connected to nasal cannula oxygen Cardiovascular status: blood pressure returned to baseline and stable Postop Assessment: no apparent nausea or vomiting Anesthetic complications: no     Last Vitals:  Vitals:   02/24/19 1309 02/24/19 1319  BP: 117/74 111/73  Pulse: 84 86  Resp: 15 12  Temp:    SpO2: 100% 100%    Last Pain:  Vitals:   02/24/19 1319  TempSrc:   PainSc: 0-No pain                 Annali Lybrand S

## 2019-02-24 NOTE — Transfer of Care (Signed)
Immediate Anesthesia Transfer of Care Note  Patient: Shannon Campbell  Procedure(s) Performed: ESOPHAGOGASTRODUODENOSCOPY (EGD) WITH PROPOFOL (N/A ) COLONOSCOPY WITH PROPOFOL (N/A )  Patient Location: Endoscopy Unit  Anesthesia Type:General  Level of Consciousness: awake, alert  and oriented  Airway & Oxygen Therapy: Patient Spontanous Breathing  Post-op Assessment: Post -op Vital signs reviewed and stable  Post vital signs: stable  Last Vitals:  Vitals Value Taken Time  BP    Temp    Pulse 97 02/24/2019 12:58 PM  Resp 19 02/24/2019 12:58 PM  SpO2 100 % 02/24/2019 12:58 PM  Vitals shown include unvalidated device data.  Last Pain:  Vitals:   02/24/19 1021  TempSrc: Tympanic  PainSc: 8          Complications: No apparent anesthesia complications

## 2019-02-24 NOTE — Anesthesia Post-op Follow-up Note (Signed)
Anesthesia QCDR form completed.        

## 2019-02-24 NOTE — H&P (Signed)
Shannon Repress, MD [address]  [address]  [place], [place] [address]  Main: [phone]  Fax: [phone] Pager: [phone]  Primary Care Physician:  Fayrene Helper, NP Primary Gastroenterologist:  Dr. Arlyss Campbell  Pre-Procedure History & Physical: HPI:  Shannon Campbell is a 43 y.o. female is here for an endoscopy and colonoscopy.   Past Medical History:  Diagnosis Date  . Acute cholecystitis 12/11/2016  . Anxiety   . Asthma   . Calculus of gallbladder with acute cholecystitis without obstruction 12/11/2016  . Chicken pox 07/08/2015  . Depression   . Fibromyalgia   . History of cardiac arrest   . Pap smear abnormality of cervix with LGSIL   . Substance abuse (HCC)   . VAIN (vaginal intraepithelial neoplasia)     Past Surgical History:  Procedure Laterality Date  . ABDOMINAL HYSTERECTOMY  2013  . BREAST EXCISIONAL BIOPSY Left 2005   benign  . CESAREAN SECTION  1999  . CHOLECYSTECTOMY N/A 12/12/2016   Procedure: LAPAROSCOPIC CHOLECYSTECTOMY;  Surgeon: Ricarda Frame, MD;  Location: [facility] ORS;  Service: General;  Laterality: N/A;  . HERNIA REPAIR  2002  . NASAL SINUS SURGERY    . WRIST SURGERY      Prior to Admission medications   Medication Sig Start Date End Date Taking? Authorizing Provider  albuterol (PROVENTIL HFA;VENTOLIN HFA) 108 (90 Base) MCG/ACT inhaler Inhale 1-2 puffs into the lungs every 6 (six) hours as needed for wheezing or shortness of breath. 03/27/18   Payton Mccallum, MD  EPINEPHrine 0.3 mg/0.3 mL IJ SOAJ injection  02/07/19   [provider]  lamoTRIgine (LAMICTAL) 150 MG tablet  02/11/19   [provider]  levocetirizine (XYZAL) 5 MG tablet  02/07/19   [provider]  traZODone (DESYREL) 50 MG tablet  02/11/19   [provider]  VYVANSE 60 MG capsule TK ONE C PO  QD 01/14/19   [provider]    Allergies as of 02/18/2019 - Review Complete 02/18/2019  Allergen Reaction Noted  . Prednisone  Other (See Comments) 07/08/2015    Family History  Problem Relation Age of Onset  . Thyroid disease Mother   . Hypertension Father   . Hemochromatosis Father   . Breast cancer Maternal Grandmother     Social History   Socioeconomic History  . Marital status: Married    Spouse name: Not on file  . Number of children: Not on file  . Years of education: Not on file  . Highest education level: Not on file  Occupational History  . Not on file  Social Needs  . Financial resource strain: Not on file  . Food insecurity:    Worry: Not on file    Inability: Not on file  . Transportation needs:    Medical: Not on file    Non-medical: Not on file  Tobacco Use  . Smoking status: Current Every Day Smoker    Packs/day: 1.00    Years: 15.00    Pack years: 15.00    Types: Cigarettes    Start date: 10/11/2014  . Smokeless tobacco: Never Used  . Tobacco comment: nicotine  Substance and Sexual Activity  . Alcohol use: No    Comment: in recovery for 3.5 years; last used ETOH 1 month ago  . Drug use: Yes    Types: "Crack" cocaine, Heroin, Cocaine    Comment: in recovery for 3.5 years; Last used drugs 1 month ago  . Sexual activity: Yes  Birth control/protection: Other-see comments    Comment: hysterectomy  Lifestyle  . Physical activity:    Days per week: Not on file    Minutes per session: Not on file  . Stress: Not on file  Relationships  . Social connections:    Talks on phone: Not on file    Gets together: Not on file    Attends religious service: Not on file    Active member of club or organization: Not on file    Attends meetings of clubs or organizations: Not on file    Relationship status: Not on file  . Intimate partner violence:    Fear of current or ex partner: Not on file    Emotionally abused: Not on file    Physically abused: Not on file    Forced sexual activity: Not on file  Other Topics Concern  . Not on file  Social History Narrative  . Not on file     Review of Systems: See HPI, otherwise negative ROS  Physical Exam: BP 116/71   Pulse 84   Temp 98.2 F (36.8 C) (Tympanic)   Resp 16   Ht 5\' 3"  (1.6 m)   Wt 74.4 kg   LMP 12/26/2011 (LMP Unknown)   SpO2 100%   BMI 29.05 kg/m  General:   Alert,  pleasant and cooperative in NAD Head:  Normocephalic and atraumatic. Neck:  Supple; no masses or thyromegaly. Lungs:  Clear throughout to auscultation.    Heart:  Regular rate and rhythm. Abdomen:  Soft, nontender and nondistended. Normal bowel sounds, without guarding, and without rebound.   Neurologic:  Alert and  oriented x4;  grossly normal neurologically.  Impression/Plan: Shannon Campbell is here for an endoscopy and colonoscopy to be performed for abdominal pain, GERD  Risks, benefits, limitations, and alternatives regarding  endoscopy and colonoscopy have been reviewed with the patient.  Questions have been answered.  All parties agreeable.   Lannette Donath, MD  02/24/2019, 10:25 AM

## 2019-02-24 NOTE — Op Note (Signed)
[facility] Gastroenterology Patient Name: Shannon Campbell Procedure Date: 02/24/2019 12:09 PM MRN: [id number] Account #: [id number] Date of Birth: 1976-04-18 Admit Type: Outpatient Age: 43 Room: [facility] ENDO ROOM 4 Gender: Female Note Status: Finalized Procedure:            Colonoscopy Indications:          Generalized abdominal pain, Change in bowel habits,                        Constipation Providers:            Lin Landsman MD, MD Referring MD:         Maryellen Pile (Referring MD) Medicines:            Monitored Anesthesia Care Complications:        No immediate complications. Estimated blood loss: None. Procedure:            Pre-Anesthesia Assessment:                       - Prior to the procedure, a History and Physical was                        performed, and patient medications and allergies were                        reviewed. The patient is competent. The risks and                        benefits of the procedure and the sedation options and                        risks were discussed with the patient. All questions                        were answered and informed consent was obtained.                        Patient identification and proposed procedure were                        verified by the physician, the nurse, the                        anesthesiologist, the anesthetist and the technician in                        the pre-procedure area in the procedure room in the                        endoscopy suite. Mental Status Examination: alert and                        oriented. Airway Examination: normal oropharyngeal                        airway and neck mobility. Respiratory Examination:                        clear to auscultation. CV Examination: normal.  Prophylactic Antibiotics: The patient does not require                        prophylactic antibiotics. Prior Anticoagulants: The                        patient has  taken no previous anticoagulant or                        antiplatelet agents. ASA Grade Assessment: II - A                        patient with mild systemic disease. After reviewing the                        risks and benefits, the patient was deemed in                        satisfactory condition to undergo the procedure. The                        anesthesia plan was to use monitored anesthesia care                        (MAC). Immediately prior to administration of                        medications, the patient was re-assessed for adequacy                        to receive sedatives. The heart rate, respiratory rate,                        oxygen saturations, blood pressure, adequacy of                        pulmonary ventilation, and response to care were                        monitored throughout the procedure. The physical status                        of the patient was re-assessed after the procedure.                       After obtaining informed consent, the colonoscope was                        passed under direct vision. Throughout the procedure,                        the patient's blood pressure, pulse, and oxygen                        saturations were monitored continuously. The                        Colonoscope was introduced through the anus and  advanced to the the terminal ileum, with identification                        of the appendiceal orifice and IC valve. The                        colonoscopy was performed with moderate difficulty due                        to a tortuous colon. Successful completion of the                        procedure was aided by applying abdominal pressure. The                        patient tolerated the procedure well. The quality of                        the bowel preparation was poor. Findings:      The perianal and digital rectal examinations were normal. Pertinent       negatives include normal  sphincter tone and no palpable rectal lesions.      The terminal ileum appeared normal.      Normal mucosa was found in the entire colon.      The retroflexed view of the distal rectum and anal verge was normal and       showed no anal or rectal abnormalities.      Extensive amounts of semi-liquid stool was found in the entire colon. Impression:           - Preparation of the colon was poor.                       - The examined portion of the ileum was normal.                       - Normal mucosa in the entire examined colon.                       - The distal rectum and anal verge are normal on                        retroflexion view.                       - Stool in the entire examined colon.                       - No specimens collected. Recommendation:       - Discharge patient to home (with escort).                       - Resume previous diet today.                       - Continue present medications.                       - Return to my office as previously scheduled. Procedure Code(s):    --- Professional ---  45378, Colonoscopy, flexible; diagnostic, including                        collection of specimen(s) by brushing or washing, when                        performed (separate procedure) Diagnosis Code(s):    --- Professional ---                       R10.84, Generalized abdominal pain                       R19.4, Change in bowel habit                       K59.00, Constipation, unspecified CPT copyright 2018 American Medical Association. All rights reserved. The codes documented in this report are preliminary and upon coder review may  be revised to meet current compliance requirements. Dr. Ulyess Mort Lin Landsman MD, MD 02/24/2019 12:52:20 PM This report has been signed electronically. Number of Addenda: 0 Note Initiated On: 02/24/2019 12:09 PM Total Procedure Duration: 0 hours 15 minutes 36 seconds       [facility]

## 2019-02-24 NOTE — Anesthesia Preprocedure Evaluation (Signed)
Anesthesia Evaluation  Patient identified by MRN, date of birth, ID band Patient awake    Reviewed: Allergy & Precautions, NPO status , Patient's Chart, lab work & pertinent test results  History of Anesthesia Complications Negative for: history of anesthetic complications  Airway Mallampati: II  TM Distance: >3 FB     Dental  (+) Dental Advidsory Given, Teeth Intact   Pulmonary neg shortness of breath, asthma , neg sleep apnea, neg COPD, neg recent URI, Current Smoker,           Cardiovascular Exercise Tolerance: Good negative cardio ROS       Neuro/Psych neg Seizures PSYCHIATRIC DISORDERS Anxiety Depression  Neuromuscular disease (fibromyalgia)    GI/Hepatic Neg liver ROS, GERD  ,  Endo/Other  negative endocrine ROS  Renal/GU negative Renal ROS  negative genitourinary   Musculoskeletal  (+) Fibromyalgia -  Abdominal Normal abdominal exam  (+)   Peds negative pediatric ROS (+)  Hematology negative hematology ROS (+)   Anesthesia Other Findings Past Medical History: No date: Anxiety No date: Asthma No date: Depression No date: Fibromyalgia No date: History of cardiac arrest No date: Pap smear abnormality of cervix with LGSIL No date: Substance abuse No date: VAIN (vaginal intraepithelial neoplasia)  Reproductive/Obstetrics                             Anesthesia Physical  Anesthesia Plan  ASA: II  Anesthesia Plan: General   Post-op Pain Management:    Induction: Intravenous  PONV Risk Score and Plan: 2 and Propofol infusion and TIVA  Airway Management Planned: Natural Airway and Nasal Cannula  Additional Equipment:   Intra-op Plan:   Post-operative Plan:   Informed Consent: I have reviewed the patients History and Physical, chart, labs and discussed the procedure including the risks, benefits and alternatives for the proposed anesthesia with the patient or authorized  representative who has indicated his/her understanding and acceptance.     Dental advisory given  Plan Discussed with: CRNA and Surgeon  Anesthesia Plan Comments:         Anesthesia Quick Evaluation

## 2019-02-24 NOTE — Telephone Encounter (Signed)
Patients spouse contacted office because his wife is experiencing stomach discomfort after her colonoscopy today.  Contacted Dr. Allegra Lai, she explained that patients colonoscopy was completely normal. She advised me to send in a prescription for Bentyl 20mg  tablet instructions take 1 tablet every 6 hours as needed for pain.  Rx has been called to [facility].  Patients husband has been provided with rx information and instructions for med use.  Thanks [employer]

## 2019-02-26 ENCOUNTER — Encounter: Payer: Self-pay | Admitting: Gastroenterology

## 2019-02-26 LAB — SURGICAL PATHOLOGY

## 2019-02-28 ENCOUNTER — Other Ambulatory Visit: Payer: Self-pay

## 2019-02-28 ENCOUNTER — Other Ambulatory Visit: Payer: Self-pay | Admitting: Gastroenterology

## 2019-02-28 DIAGNOSIS — A048 Other specified bacterial intestinal infections: Secondary | ICD-10-CM

## 2019-02-28 MED ORDER — OMEPRAZOLE 40 MG PO CPDR: 40.0000 mg | DELAYED_RELEASE_CAPSULE | Freq: Two times a day (BID) | ORAL | 0 refills | Status: DC

## 2019-02-28 MED ORDER — CLARITHROMYCIN 500 MG PO TABS: 500.0000 mg | ORAL_TABLET | Freq: Two times a day (BID) | ORAL | 0 refills | Status: AC

## 2019-02-28 MED ORDER — AMOXICILLIN 500 MG PO CAPS: 1000.0000 mg | ORAL_CAPSULE | Freq: Two times a day (BID) | ORAL | 0 refills | Status: AC

## 2019-03-10 ENCOUNTER — Other Ambulatory Visit: Payer: Self-pay

## 2019-03-10 ENCOUNTER — Telehealth: Payer: Self-pay | Admitting: Gastroenterology

## 2019-03-10 MED ORDER — FLUCONAZOLE 150 MG PO TABS: ORAL_TABLET | ORAL | 0 refills | Status: DC

## 2019-03-10 NOTE — Telephone Encounter (Signed)
Patient called & has has for a medication for her yeast infection. Dr Allegra Lai has put  Her on two strong antibiotics. Please call into the [facility] in [place].

## 2019-03-10 NOTE — Telephone Encounter (Signed)
Please okay for me to call in Diflucan 150mg  to pt's pharmacy.

## 2019-03-10 NOTE — Telephone Encounter (Signed)
Pt notified Diflucan 150mg  has been sent to pt's pharmacy.

## 2019-03-20 ENCOUNTER — Ambulatory Visit: Payer: Medicare Other | Admitting: Gastroenterology

## 2019-05-06 ENCOUNTER — Ambulatory Visit: Payer: Medicare Other | Admitting: Gastroenterology

## 2019-05-27 ENCOUNTER — Ambulatory Visit
Admission: EM | Admit: 2019-05-27 | Discharge: 2019-05-27 | Disposition: A | Discharge status: Home / Self Care | Payer: Medicare Other

## 2019-05-27 ENCOUNTER — Encounter: Payer: Self-pay | Admitting: Emergency Medicine

## 2019-05-27 ENCOUNTER — Telehealth: Payer: Self-pay | Admitting: Urgent Care

## 2019-05-27 ENCOUNTER — Other Ambulatory Visit: Payer: Self-pay

## 2019-05-27 DIAGNOSIS — R5383 Other fatigue: Secondary | ICD-10-CM | POA: Diagnosis not present

## 2019-05-27 DIAGNOSIS — W57XXXA Bitten or stung by nonvenomous insect and other nonvenomous arthropods, initial encounter: Secondary | ICD-10-CM

## 2019-05-27 DIAGNOSIS — R21 Rash and other nonspecific skin eruption: Secondary | ICD-10-CM

## 2019-05-27 DIAGNOSIS — R51 Headache: Secondary | ICD-10-CM | POA: Diagnosis not present

## 2019-05-27 DIAGNOSIS — H9203 Otalgia, bilateral: Secondary | ICD-10-CM

## 2019-05-27 MED ORDER — DOXYCYCLINE HYCLATE 100 MG PO CAPS: 100.0000 mg | ORAL_CAPSULE | Freq: Two times a day (BID) | ORAL | 0 refills | Status: DC

## 2019-05-27 NOTE — Telephone Encounter (Signed)
  [address], [address] [place], [place] [address] [phone]   Date: 05/27/2019  Name: Shannon Campbell DOB: 1976-08-06 MRN: [id number]  Re: Prescription  Patient seen earlier today. Rx did not transmit to the pharmacy for the ordered doxycycline. Rx resent at this time. Patient advised that medication should be available at pharmacy for her to pick up. Clinic staff apologized for inconvenience.   Quentin Mulling, MSN, APRN, FNP-C, CEN Advanced Practice Provider [facility] 05/27/2019 3:49 PM

## 2019-05-27 NOTE — ED Triage Notes (Signed)
Patient states that she was bit by a tick 3 weeks ago.  Patient states that she has since developed fatigue and joint pain.  Patient also reports bilateral ear pain that started last Tuesday.  Patient denies fevers.

## 2019-05-27 NOTE — ED Provider Notes (Signed)
[address], [address] [place], [place] [address] [phone]   Name: Shannon Campbell DOB: 1976/08/09 MRN: [id number] CSN: [id number] PCP: Fayrene Helper, NP  Arrival date and time:  05/27/19 1217  Chief Complaint:  Insect Bite (tick) and Otalgia  NOTE: Prior to seeing the patient today, I have reviewed the triage nursing documentation and vital signs. Clinical staff has updated patient's PMH/PSHx, current medication list, and drug allergies/intolerances to ensure comprehensive history available to assist in medical decision making.   History:   HPI: Shannon Campbell is a 43 y.o. female who presents today with complaints of BILATERAL ear pain and a tick bite. Patient bitten by a tick on her RIGHT thigh x 3 weeks ago. She developed what she describes as a rash that "looked like a bullseye". Over the course of the last three weeks, patient has developed progressive daily headaches, diffuse myalgias, and polyarthralgia. She notes that she has been excessively fatigued. Patient denies associated fevers. On 05/20/2019, patient developed pain in her BILATERAL ears. She states, "I have noticed that some pus has been coming out of my ears sometimes". Patient also makes mention of tender palpable LAD to her RIGHT neck and LEFT post-auricular area.   Past Medical History:  Diagnosis Date   Acute cholecystitis 12/11/2016   Anxiety    Asthma    Calculus of gallbladder with acute cholecystitis without obstruction 12/11/2016   Chicken pox 07/08/2015   Depression    Fibromyalgia    History of cardiac arrest    Pap smear abnormality of cervix with LGSIL    Substance abuse (HCC)    VAIN (vaginal intraepithelial neoplasia)     Past Surgical History:  Procedure Laterality Date   ABDOMINAL HYSTERECTOMY  2013   BREAST EXCISIONAL BIOPSY Left 2005   benign   CESAREAN SECTION  1999   CHOLECYSTECTOMY N/A 12/12/2016   Procedure: LAPAROSCOPIC CHOLECYSTECTOMY;  Surgeon: Ricarda Frame, MD;  Location: [facility] ORS;  Service: General;  Laterality: N/A;   COLONOSCOPY WITH PROPOFOL N/A 02/24/2019   Procedure: COLONOSCOPY WITH PROPOFOL;  Surgeon: Toney Reil, MD;  Location: [facility] ENDOSCOPY;  Service: Gastroenterology;  Laterality: N/A;   ESOPHAGOGASTRODUODENOSCOPY (EGD) WITH PROPOFOL N/A 02/24/2019   Procedure: ESOPHAGOGASTRODUODENOSCOPY (EGD) WITH PROPOFOL;  Surgeon: Toney Reil, MD;  Location: [facility]COPY;  Service: Gastroenterology;  Laterality: N/A;   HERNIA REPAIR  2002   NASAL SINUS SURGERY     WRIST SURGERY      Family History  Problem Relation Age of Onset   Thyroid disease Mother    Hypertension Father    Hemochromatosis Father    Breast cancer Maternal Grandmother     Social History   Socioeconomic History   Marital status: Married    Spouse name: Not on file   Number of children: Not on file   Years of education: Not on file   Highest education level: Not on file  Occupational History   Not on file  Social Needs   Financial resource strain: Not on file   Food insecurity:    Worry: Not on file    Inability: Not on file   Transportation needs:    Medical: Not on file    Non-medical: Not on file  Tobacco Use   Smoking status: Current Every Day Smoker    Packs/day: 1.00    Years: 15.00    Pack years: 15.00    Types: Cigarettes    Start date: 10/11/2014   Smokeless tobacco: Never Used   Tobacco  comment: nicotine  Substance and Sexual Activity   Alcohol use: No    Comment: in recovery for 3.5 years; last used ETOH 1 month ago   Drug use: Yes    Types: "Crack" cocaine, Heroin, Cocaine    Comment: in recovery for 3.5 years; Last used drugs 1 month ago   Sexual activity: Yes    Birth control/protection: Other-see comments    Comment: hysterectomy  Lifestyle   Physical activity:    Days per week: Not on file    Minutes per session: Not on file   Stress: Not on file  Relationships   Social  connections:    Talks on phone: Not on file    Gets together: Not on file    Attends religious service: Not on file    Active member of club or organization: Not on file    Attends meetings of clubs or organizations: Not on file    Relationship status: Not on file   Intimate partner violence:    Fear of current or ex partner: Not on file    Emotionally abused: Not on file    Physically abused: Not on file    Forced sexual activity: Not on file  Other Topics Concern   Not on file  Social History Narrative   Not on file    Patient Active Problem List   Diagnosis Date Noted   Gastroesophageal reflux disease    Black stools    Chronic constipation    ADHD 02/18/2019   Bipolar 2 disorder (HCC) 03/20/2018   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/2018   Hyperlipidemia, mixed 01/29/2018   Fibromyalgia 01/03/2018   Generalized anxiety disorder 01/03/2018   History of cardiac arrest 01/03/2018   Substance abuse (HCC) 01/03/2018   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   Numbness of hand 06/29/2017   Hereditary hemochromatosis (HCC) 06/03/2017   Uncomplicated alcohol dependence (HCC) 10/14/2016   Cervical dysplasia 08/09/2016   Chest pain 08/07/2016   Chronic fatigue 11/01/2015   Chronic pain of multiple joints 11/01/2015   Elevated antinuclear antibody (ANA) level 11/01/2015   PMS (premenstrual syndrome) 07/09/2015   Tobacco abuse 07/09/2015   Asthma without status asthmaticus 07/08/2015   Clinical depression 07/08/2015   Academic skill disorder 07/08/2015   Inflamed nasal mucosa 07/08/2015   Avitaminosis D 07/08/2015    Home Medications:    Current Meds  Medication Sig   albuterol (PROVENTIL HFA;VENTOLIN HFA) 108 (90 Base) MCG/ACT inhaler Inhale 1-2 puffs into the lungs every 6 (six) hours as needed for wheezing or shortness of breath.   lamoTRIgine (LAMICTAL) 150 MG tablet    levocetirizine (XYZAL) 5 MG tablet     omeprazole (PRILOSEC) 40 MG capsule Take 1 capsule (40 mg total) by mouth 2 (two) times daily for 14 days.   traZODone (DESYREL) 50 MG tablet    VYVANSE 60 MG capsule TK ONE C PO  QD    Allergies:   Prednisone  Review of Systems (ROS): Review of Systems  Constitutional: Positive for fatigue. Negative for chills and fever.  HENT: Positive for ear discharge and ear pain. Negative for facial swelling, rhinorrhea, sinus pressure, sinus pain, sore throat and trouble swallowing.   Respiratory: Negative for cough and shortness of breath.   Cardiovascular: Negative for chest pain and palpitations.  Gastrointestinal: Negative for abdominal pain, diarrhea, nausea and vomiting.  Musculoskeletal: Positive for arthralgias and myalgias. Negative for back pain, joint swelling and neck pain.  Skin: Positive for rash.  Neurological: Positive for headaches. Negative for dizziness and weakness.  Hematological: Positive for adenopathy.     Physical Exam:  Triage Vital Signs ED Triage Vitals  Enc Vitals Group     BP 05/27/19 1242 112/83     Pulse Rate 05/27/19 1242 85     Resp 05/27/19 1242 16     Temp 05/27/19 1242 98.1 F (36.7 C)     Temp Source 05/27/19 1242 Oral     SpO2 05/27/19 1242 99 %     Weight 05/27/19 1239 160 lb (72.6 kg)     Height 05/27/19 1239 5\' 3"  (1.6 m)     Head Circumference --      Peak Flow --      Pain Score 05/27/19 1239 10     Pain Loc --      Pain Edu? --      Excl. in GC? --     Physical Exam  Constitutional: She is oriented to person, place, and time and well-developed, well-nourished, and in no distress.  HENT:  Head: Normocephalic and atraumatic.  Right Ear: Hearing, tympanic membrane, external ear and ear canal normal. No drainage, swelling or tenderness. Tympanic membrane is not injected. No middle ear effusion.  Left Ear: Hearing, external ear and ear canal normal. No drainage, swelling or tenderness. Tympanic membrane is injected.  No middle ear  effusion.  Mouth/Throat: Oropharynx is clear and moist and mucous membranes are normal.  Eyes: Pupils are equal, round, and reactive to light. EOM are normal.  Neck: Normal range of motion. Neck supple. No tracheal deviation present.  Cardiovascular: Normal rate, regular rhythm, normal heart sounds and intact distal pulses. Exam reveals no gallop and no friction rub.  No murmur heard. Pulmonary/Chest: Effort normal and breath sounds normal. No respiratory distress. She has no wheezes. She has no rales.  Lymphadenopathy:       Head (right side): Submandibular adenopathy present.       Head (left side): Posterior auricular adenopathy present.    She has cervical adenopathy.       Right cervical: Superficial cervical adenopathy present.  Neurological: She is alert and oriented to person, place, and time.  Skin: Skin is warm and dry. Rash noted. Rash is macular (fading rash to RIGHT thigh; no discreet erythema migrans appearance).  Psychiatric: Mood, affect and judgment normal.  Nursing note and vitals reviewed.    Urgent Care Treatments / Results:   LABS: PLEASE NOTE: all labs that were ordered this encounter are listed, however only abnormal results are displayed. Labs Reviewed - No data to display  EKG: -None  RADIOLOGY: No results found.  PRODEDURES: Procedures  MEDICATIONS RECEIVED THIS VISIT: Medications - No data to display  PERTINENT CLINICAL COURSE NOTES/UPDATES:   Initial Impression / Assessment and Plan / Urgent Care Course:    Shannon Campbell is a 43 y.o. female who presents to [facility] today with complaints of Insect Bite (tick) and Otalgia  Pertinent labs & imaging results that were available during my care of the patient were personally reviewed by me and considered in my medical decision making (see lab/imaging section of note for values and interpretations).  Exam reveals rash and tender palpable LAD in the RIGHT neck and LEFT post-auricular space.  (+) headaches, myalgias, and arthralgias. Symptoms progressive x 3 weeks following removal of tick. Patient unable to advise on type of tick, however notes that tick was engorged when removed. No associated fevers. Concern for tick borne illness.  Will proceed with treatment with 10 day course of doxycycline. May use Tylenol and/or Ibuprofen as needed for pain/fever. Patient to monitor symptoms and call clinic for progression.   Discussed follow up with primary care physician in 1 week for re-evaluation. I have reviewed the follow up and strict return precautions for any new or worsening symptoms. Patient is aware of symptoms that would be deemed urgent/emergent, and would thus require further evaluation either here or in the emergency department. At the time of discharge, she verbalized understanding and consent with the discharge plan as it was reviewed with her. All questions were fielded by provider and/or clinic staff prior to patient discharge.    Final Clinical Impressions(s) / Urgent Care Diagnoses:   Final diagnoses:  Tick bite, initial encounter  Otalgia of both ears    New Prescriptions:  Doxycycline 100 mg tablets Sig: 1 tablet (100 mg) my mouth TWICE a day for 10 days Disp #: 20 Refill: 0  Controlled Substance Prescriptions:   Controlled Substance Registry consulted? Not Applicable  NOTE: This note was prepared using Dragon dictation software along with smaller phrase technology. Despite my best ability to proofread, there is the potential that transcriptional errors may still occur from this process, and are completely unintentional.     Verlee Monte, NP 05/28/19 [phone]

## 2019-05-27 NOTE — Discharge Instructions (Addendum)
It was very nice meeting you today in clinic. Thank you for entrusting me with your care.   Please utilize the medications that we discussed. Your prescriptions have been called in to your pharmacy. Monitor for progressive symptoms.   Make arrangements to follow up with your regular doctor in 1 week for re-evaluation. If your symptoms/condition worsens, please seek follow up care either here or in the ER. Please remember, our [facility] providers are "right here with you" when you need [place].   Again, it was my pleasure to take care of you today. Thank you for choosing our clinic. I hope that you start to feel better quickly.   Quentin Mulling, MSN, APRN, FNP-C, CEN Advanced Practice Provider [facility]

## 2019-06-05 ENCOUNTER — Ambulatory Visit: Payer: Medicare Other | Admitting: Gastroenterology

## 2019-06-24 ENCOUNTER — Telehealth: Payer: Self-pay

## 2019-06-24 ENCOUNTER — Other Ambulatory Visit: Payer: Medicare Other

## 2019-06-24 DIAGNOSIS — Z20822 Contact with and (suspected) exposure to covid-19: Secondary | ICD-10-CM

## 2019-06-24 NOTE — Telephone Encounter (Signed)
Caryl Pina with Evern Bio NP, [facility], requests COVID 19 test for exposure and symptoms. Scheduled for today. Practice # [phone] Fax [phone]

## 2019-06-28 LAB — NOVEL CORONAVIRUS, NAA: SARS-CoV-2, NAA: NOT DETECTED

## 2019-08-18 ENCOUNTER — Other Ambulatory Visit: Payer: Self-pay

## 2019-08-18 ENCOUNTER — Ambulatory Visit
Admission: EM | Admit: 2019-08-18 | Discharge: 2019-08-18 | Disposition: A | Discharge status: Home / Self Care | Payer: Medicare Other | Attending: Family Medicine | Admitting: Family Medicine

## 2019-08-18 ENCOUNTER — Ambulatory Visit (INDEPENDENT_AMBULATORY_CARE_PROVIDER_SITE_OTHER): Payer: Medicare Other

## 2019-08-18 DIAGNOSIS — W540XXA Bitten by dog, initial encounter: Secondary | ICD-10-CM | POA: Diagnosis not present

## 2019-08-18 DIAGNOSIS — S60021A Contusion of right index finger without damage to nail, initial encounter: Secondary | ICD-10-CM | POA: Diagnosis not present

## 2019-08-18 DIAGNOSIS — W0110XA Fall on same level from slipping, tripping and stumbling with subsequent striking against unspecified object, initial encounter: Secondary | ICD-10-CM

## 2019-08-18 DIAGNOSIS — M79644 Pain in right finger(s): Secondary | ICD-10-CM

## 2019-08-18 DIAGNOSIS — S61258A Open bite of other finger without damage to nail, initial encounter: Secondary | ICD-10-CM

## 2019-08-18 MED ORDER — MUPIROCIN 2 % EX OINT: TOPICAL_OINTMENT | CUTANEOUS | 0 refills | Status: DC

## 2019-08-18 MED ORDER — AMOXICILLIN-POT CLAVULANATE 875-125 MG PO TABS: 1.0000 | ORAL_TABLET | Freq: Two times a day (BID) | ORAL | 0 refills | Status: DC

## 2019-08-18 NOTE — ED Provider Notes (Signed)
[facility] ____________________________________________  Time seen: Approximately 11:45 AM  I have reviewed the triage vital signs and the nursing notes.   HISTORY  Chief Complaint Hand Pain (right index finger) and Animal Bite   HPI Shannon Campbell is a 43 y.o. female presenting for evaluation of right index finger pain.  Patient reports 2 injuries.  Reports she is currently redoing her floors, and when she walked across it causing her to slip and she caught her balance against her wall but hitting her right index finger at the wall.  Reports this injury was yesterday.  States this morning her trauma had a piece of the wood flooring in his mouth and when she went to take it away he bit her right index finger once.  Pain worse with direct palpation and movement.  Denies alleviating measures.  Reports her tetanus immunization is up-to-date, last given 2018.  Reports this is her pet and is up-to-date on rabies immunization.  Denies recent cough, congestion or sickness.    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/2018   Hyperlipidemia, mixed 01/29/2018   Fibromyalgia 01/03/2018   Generalized anxiety disorder 01/03/2018   History of cardiac arrest 01/03/2018   Substance abuse ([place]) 01/03/2018   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   Numbness of hand 06/29/2017    Hereditary hemochromatosis ([place]) 01/75/1025   Uncomplicated alcohol dependence ([place]) 10/14/2016   Cervical dysplasia 08/09/2016   Chest pain 08/07/2016   Chronic fatigue 11/01/2015   Chronic pain of multiple joints 11/01/2015   Elevated antinuclear antibody (ANA) level 11/01/2015   PMS (premenstrual syndrome) 07/09/2015   Tobacco abuse 07/09/2015   Asthma without status asthmaticus 07/08/2015   Clinical depression 07/08/2015   Academic skill disorder 07/08/2015   Inflamed nasal mucosa 07/08/2015   Avitaminosis D 07/08/2015    Past Surgical History:  Procedure Laterality Date   ABDOMINAL HYSTERECTOMY  2013   BREAST EXCISIONAL BIOPSY Left 2005   benign   CESAREAN SECTION  1999   CHOLECYSTECTOMY N/A 12/12/2016   Procedure: LAPAROSCOPIC CHOLECYSTECTOMY;  Surgeon: Clayburn Pert, MD;  Location: [facility] ORS;  Service: General;  Laterality: N/A;   COLONOSCOPY WITH PROPOFOL N/A 02/24/2019   Procedure: COLONOSCOPY WITH PROPOFOL;  Surgeon: Lin Landsman, MD;  Location: [facility] ENDOSCOPY;  Service: Gastroenterology;  Laterality: N/A;   ESOPHAGOGASTRODUODENOSCOPY (EGD) WITH PROPOFOL N/A 02/24/2019   Procedure: ESOPHAGOGASTRODUODENOSCOPY (EGD) WITH PROPOFOL;  Surgeon: Lin Landsman, MD;  Location: [place];  Service: Gastroenterology;  Laterality: N/A;   HERNIA REPAIR  2002   NASAL SINUS SURGERY     WRIST SURGERY       No current facility-administered medications for this encounter.   Current Outpatient Medications:    albuterol (PROVENTIL HFA;VENTOLIN HFA) 108 (90 Base) MCG/ACT inhaler, Inhale 1-2 puffs into the lungs every 6 (six) hours as needed for  wheezing or shortness of breath., Disp: 1 Inhaler, Rfl: 0   buPROPion (WELLBUTRIN XL) 300 MG 24 hr tablet, TK 1 T PO QAM, Disp: , Rfl:    EPINEPHrine 0.3 mg/0.3 mL IJ SOAJ injection, , Disp: , Rfl:    lamoTRIgine (LAMICTAL) 150 MG tablet, , Disp: , Rfl:    traZODone (DESYREL) 50 MG tablet, , Disp: , Rfl:      VYVANSE 60 MG capsule, TK ONE C PO  QD, Disp: , Rfl:    amoxicillin-clavulanate (AUGMENTIN) 875-125 MG tablet, Take 1 tablet by mouth every 12 (twelve) hours., Disp: 14 tablet, Rfl: 0   mupirocin ointment (BACTROBAN) 2 %, Apply two times a day for 5 days., Disp: 22 g, Rfl: 0   omeprazole (PRILOSEC) 40 MG capsule, Take 1 capsule (40 mg total) by mouth 2 (two) times daily for 14 days., Disp: 28 capsule, Rfl: 0  Allergies Prednisone  Family History  Problem Relation Age of Onset   Thyroid disease Mother    Hypertension Father    Hemochromatosis Father    Breast cancer Maternal Grandmother     Social History Social History   Tobacco Use   Smoking status: Current Every Day Smoker    Packs/day: 1.00    Years: 15.00    Pack years: 15.00    Types: Cigarettes    Start date: 10/11/2014   Smokeless tobacco: Never Used   Tobacco comment: nicotine  Substance Use Topics   Alcohol use: No    Comment: in recovery for 3.5 years; last used ETOH 1 month ago   Drug use: Yes    Types: "Crack" cocaine, Heroin, Cocaine    Comment: in recovery for 3.5 years; Last used drugs 1 month ago    Review of Systems Constitutional: No fever ENT: No sore throat. Cardiovascular: Denies chest pain. Respiratory: Denies shortness of breath. Gastrointestinal: No abdominal pain.   Musculoskeletal: Positive right index finger pain Skin: Positive right index finger laceration.   ____________________________________________   PHYSICAL EXAM:  VITAL SIGNS: ED Triage Vitals  Enc Vitals Group     BP 08/18/19 1126 119/84     Pulse Rate 08/18/19 1126 86     Resp 08/18/19 1126 16     Temp 08/18/19 1126 98.2 F (36.8 C)     Temp Source 08/18/19 1126 Oral     SpO2 08/18/19 1126 100 %     Weight 08/18/19 1122 160 lb (72.6 kg)     Height 08/18/19 1122 5\' 3"  (1.6 m)     Head Circumference --      Peak Flow --      Pain Score 08/18/19 1122 8     Pain Loc --      Pain Edu? --      Excl. in  GC? --     Constitutional: Alert and oriented. Well appearing and in no acute distress. Eyes: Conjunctivae are normal. ENT      Head: Normocephalic and atraumatic. Cardiovascular: Normal rate, regular rhythm. Grossly normal heart sounds.  Good peripheral circulation. Respiratory: Normal respiratory effort without tachypnea nor retractions. Breath sounds are clear and equal bilaterally. No wheezes, rales, rhonchi. Musculoskeletal: Steady gait. Except: Right index finger distal middle phalanx to distal phalanx mild to moderate tenderness to direct palpation with mild localized swelling, good resisted distal flexion and extension, no motor or tendon deficit noted, right hand otherwise nontender.  Right distal index finger medial aspect superficial 1 cm laceration, no active bleeding, lateral aspect less than 0.5  cm superficial abrasion noted, no foreign body noted.  Neurologic:  Normal speech and language. No gross focal neurologic deficits are appreciated. Speech is normal. No gait instability.  Skin:  Skin is warm, dry and intact. No rash noted. Psychiatric: Mood and affect are normal. Speech and behavior are normal. Patient exhibits appropriate insight and judgment   ___________________________________________   LABS (all labs ordered are listed, but only abnormal results are displayed)  Labs Reviewed - No data to display  RADIOLOGY  Dg Finger Index Right  Result Date: 08/18/2019 CLINICAL DATA:  Right index finger injury in a fall yesterday. The patient suffered a dog bite to the right index finger today. Initial encounter. EXAM: RIGHT INDEX FINGER 2+V COMPARISON:  Plain films right hand 06/25/2017. FINDINGS: There is no evidence of fracture or dislocation. There is no evidence of arthropathy or other focal bone abnormality. Soft tissues are unremarkable. IMPRESSION: Normal exam. Electronically Signed   By: Drusilla Kannerhomas  Dalessio M.D.   On: 08/18/2019 12:20    ____________________________________________   PROCEDURES Procedures   Wound cleaned by nursing staff in urgent care.  INITIAL IMPRESSION / ASSESSMENT AND PLAN / ED COURSE  Pertinent labs & imaging results that were available during my care of the patient were reviewed by me and considered in my medical decision making (see chart for details).  Well-appearing patient.  Right index injury as well as dog bite.  Patient up-to-date on tetanus immunization.  Dog up-to-date on rabies immunization.  [facility]  Will start on oral Augmentin and topical Bactroban.  Right index finger as above, negative x-ray.  Finger splint given for 2 to 3 days for supportive care.  Ice, stretching, over-the-counter ibuprofen Tylenol as needed.  Monitor.Discussed indication, risks and benefits of medications with patient.  Discussed follow up with Primary care physician this week. Discussed follow up and return parameters including no resolution or any worsening concerns. Patient verbalized understanding and agreed to plan.   ____________________________________________   FINAL CLINICAL IMPRESSION(S) / ED DIAGNOSES  Final diagnoses:  Contusion of right index finger without damage to nail, initial encounter  Dog bite of index finger, initial encounter     ED Discharge Orders         Ordered    amoxicillin-clavulanate (AUGMENTIN) 875-125 MG tablet  Every 12 hours     08/18/19 1216    mupirocin ointment (BACTROBAN) 2 %     08/18/19 1216           Note: This dictation was prepared with Dragon dictation along with smaller phrase technology. Any transcriptional errors that result from this process are unintentional.         Renford DillsMiller, Vanissa Strength, NP 08/18/19 1232

## 2019-08-18 NOTE — Discharge Instructions (Addendum)
Take medication as prescribed. Rest. Keep clean. Monitor. Use finger splint for 2-3 days.   Follow up with your primary care physician this week as needed. Return to Urgent care for new or worsening concerns.

## 2019-08-18 NOTE — ED Triage Notes (Signed)
Patient states that she injured her right index finger by falling yesterday. States that she feels like her finger may be broke due to pain, reports that this morning her dog had wood in its mouth and she tried to take it away and he snapped her with his mouth. Patient has puncture wounds to the same finger. Patient states that this is her own personal dog and it UTD on vaccines. Patient UTD on TDap (06/25/2017).

## 2019-09-09 ENCOUNTER — Encounter: Payer: Self-pay | Admitting: Neurology

## 2019-09-09 ENCOUNTER — Other Ambulatory Visit: Payer: Self-pay

## 2019-09-09 ENCOUNTER — Telehealth: Payer: Self-pay | Admitting: Neurology

## 2019-09-09 ENCOUNTER — Ambulatory Visit (INDEPENDENT_AMBULATORY_CARE_PROVIDER_SITE_OTHER): Payer: Medicare Other | Admitting: Neurology

## 2019-09-09 ENCOUNTER — Encounter: Payer: Self-pay | Admitting: Obstetrics and Gynecology

## 2019-09-09 VITALS — BP 122/86 | HR 100 | Temp 97.8°F | Ht 63.0 in | Wt 159.0 lb

## 2019-09-09 DIAGNOSIS — R200 Anesthesia of skin: Secondary | ICD-10-CM | POA: Diagnosis not present

## 2019-09-09 DIAGNOSIS — F3181 Bipolar II disorder: Secondary | ICD-10-CM

## 2019-09-09 DIAGNOSIS — R5382 Chronic fatigue, unspecified: Secondary | ICD-10-CM | POA: Diagnosis not present

## 2019-09-09 DIAGNOSIS — M2550 Pain in unspecified joint: Secondary | ICD-10-CM | POA: Diagnosis not present

## 2019-09-09 DIAGNOSIS — H539 Unspecified visual disturbance: Secondary | ICD-10-CM

## 2019-09-09 DIAGNOSIS — M797 Fibromyalgia: Secondary | ICD-10-CM | POA: Diagnosis not present

## 2019-09-09 DIAGNOSIS — G8929 Other chronic pain: Secondary | ICD-10-CM

## 2019-09-09 DIAGNOSIS — F411 Generalized anxiety disorder: Secondary | ICD-10-CM

## 2019-09-09 NOTE — Progress Notes (Signed)
[facility]  PATIENT: Shannon Campbell DOB: 01-Dec-1976  REFERRING DOCTOR OR PCP:  Orson Eva, NP SOURCE: Patient, notes from primary care, laboratory reports  _________________________________   HISTORICAL  CHIEF COMPLAINT:  Chief Complaint  Patient presents with   New Patient (Initial Visit)    RM 12 with husband (temp: 97.5) Paper referral from Orson Eva, NP for fatigue, neuropathy hands/legs, joint pain. Sister recently dx with MS. Sx started in the last couple years. Having intermittent blurry vision. She last went to eye doctor 2019. The past 6 months, she has dizzy spells, falls. Last week, her whole left leg went numb and lasted for 1.5 hr. She gets intermittent shakes. Has intermittent slurred speech. No recent MRI or CT scan.    Headache    Started about a month ago.    disability    On disability for depression, AHDH, degenerative disc disease, RLS    HISTORY OF PRESENT ILLNESS:  I had the pleasure of seeing your patient, Shannon Campbell, at [facility] for neurologic consultation regarding her intermittent neurologic symptoms and concern that she might have multiple sclerosis.  She is a 43 year old woman with a history of depression, degenerative disc disease and restless leg syndrome.  Last year, she began to note some intermittent blurry vision.   No diplopia.   A spell would last about 30 minutes and she would have about one a month.  She went to an ophthalmologist and was told that her eyes were fine.  Often she has a headache at the same time as the visual spells.  She also began to have dizzy spells over the past 5 months.   Along with the dizziness, she notes some numbness and clumsiness and weakness. These spells last 20-120 minutes and mostly occur in the evenings, now most days.    Spells affect her left side more than right.    She has occasional spells during the day but generally is better during the mornings  and early afternoons.      She also has had slurred speech and some trouble coming up with the right words over the last 2-3 years.    She feels more forgetful.     She notes severe fatigue over the last 2 years.  She has had thrush a few times over the last.year.   She takes Flonase for allergies.    Her sister was diagnosed with possible MS.    Her grandfather had Alzheimer and an aunt had brain cancer.      I reviewed notes from primary care.  Several labs have been done including [facility] spotted fever, Lyme, TSH, vitamin D.  These were normal or negative.  REVIEW OF SYSTEMS: Constitutional: No fevers, chills, sweats, or change in appetite.  Notes fatigue Eyes: As above Ear, nose and throat: No hearing loss, ear pain, nasal congestion, sore throat Cardiovascular: No chest pain, palpitations Respiratory: No shortness of breath at rest or with exertion.   No wheezes GastrointestinaI: No nausea, vomiting, diarrhea, abdominal pain, fecal incontinence Genitourinary: No dysuria, urinary retention or frequency.  No nocturia. Musculoskeletal:Notes neck pain and pain in muscles and joints  integumentary: No rashes now but she had some earlier this year.       Neurological: as above Psychiatric: Notes depression (sees Dr. Janeece Riggers).  Notes anxiety.   She was diagnosed with Bipolar without mania when she was 13.   She has had ADHD since her preschool years.   Endocrine: No palpitations,  diaphoresis, change in appetite, change in weigh or increased thirst Hematologic/Lymphatic: No anemia, purpura, petechiae. Allergic/Immunologic: No itchy/runny eyes, nasal congestion, recent allergic reactions, rashes  ALLERGIES: Allergies  Allergen Reactions   Prednisone Anaphylaxis and Other (See Comments)     Makes her moody Other reaction(s): Other (See Comments) Makes her moody    HOME MEDICATIONS:  Current Outpatient Medications:    albuterol (PROVENTIL HFA;VENTOLIN HFA) 108 (90 Base)  MCG/ACT inhaler, Inhale 1-2 puffs into the lungs every 6 (six) hours as needed for wheezing or shortness of breath., Disp: 1 Inhaler, Rfl: 0   buPROPion (WELLBUTRIN XL) 300 MG 24 hr tablet, TK 1 T PO QAM, Disp: , Rfl:    clonazePAM (KLONOPIN) 0.5 MG tablet, Take 0.5 mg by mouth as needed for anxiety., Disp: , Rfl:    EPINEPHrine 0.3 mg/0.3 mL IJ SOAJ injection, , Disp: , Rfl:    lamoTRIgine (LAMICTAL) 150 MG tablet, , Disp: , Rfl:    traZODone (DESYREL) 50 MG tablet, , Disp: , Rfl:    VYVANSE 60 MG capsule, TK ONE C PO  QD, Disp: , Rfl:   PAST MEDICAL HISTORY: Past Medical History:  Diagnosis Date   Acute cholecystitis 12/11/2016   Anxiety    Asthma    Calculus of gallbladder with acute cholecystitis without obstruction 12/11/2016   Chicken pox 07/08/2015   Depression    Fibromyalgia    History of cardiac arrest    Pap smear abnormality of cervix with LGSIL    Substance abuse ([place])    VAIN (vaginal intraepithelial neoplasia)     PAST SURGICAL HISTORY: Past Surgical History:  Procedure Laterality Date   ABDOMINAL HYSTERECTOMY  2013   BREAST EXCISIONAL BIOPSY Left 2005   benign   CESAREAN SECTION  1999   CHOLECYSTECTOMY N/A 12/12/2016   Procedure: LAPAROSCOPIC CHOLECYSTECTOMY;  Surgeon: Clayburn Pert, MD;  Location: [facility];  Service: General;  Laterality: N/A;   COLONOSCOPY WITH PROPOFOL N/A 02/24/2019   Procedure: COLONOSCOPY WITH PROPOFOL;  Surgeon: Lin Landsman, MD;  Location: [facility];  Service: Gastroenterology;  Laterality: N/A;   ESOPHAGOGASTRODUODENOSCOPY (EGD) WITH PROPOFOL N/A 02/24/2019   Procedure: ESOPHAGOGASTRODUODENOSCOPY (EGD) WITH PROPOFOL;  Surgeon: Lin Landsman, MD;  Location: [place];  Service: Gastroenterology;  Laterality: N/A;   HERNIA REPAIR  2002   NASAL SINUS SURGERY     WRIST SURGERY      FAMILY HISTORY: Family History  Problem Relation Age of Onset   Thyroid disease Mother    Hypertension  Father    Hemochromatosis Father    Breast cancer Maternal Grandmother     SOCIAL HISTORY:  Social History   Socioeconomic History   Marital status: Married    Spouse name: Not on file   Number of children: 2   Years of education: 12   Highest education level: Not on file  Occupational History   Occupation: Disability  Social Designer, fashion/clothing strain: Not on file   Food insecurity    Worry: Not on file    Inability: Not on file   Transportation needs    Medical: Not on file    Non-medical: Not on file  Tobacco Use   Smoking status: Current Every Day Smoker    Packs/day: 1.00    Years: 15.00    Pack years: 15.00    Types: Cigarettes    Start date: 10/11/2014   Smokeless tobacco: Never Used   Tobacco comment: nicotine  Substance and Sexual Activity  Alcohol use: No    Comment: in recovery for 3.5 years; last used ETOH 1 month ago   Drug use: Yes    Types: "Crack" cocaine, Heroin, Cocaine    Comment: in recovery for 3.5 years; Last used drugs 1 month ago   Sexual activity: Yes    Birth control/protection: Other-see comments    Comment: hysterectomy  Lifestyle   Physical activity    Days per week: Not on file    Minutes per session: Not on file   Stress: Not on file  Relationships   Social connections    Talks on phone: Not on file    Gets together: Not on file    Attends religious service: Not on file    Active member of club or organization: Not on file    Attends meetings of clubs or organizations: Not on file    Relationship status: Not on file   Intimate partner violence    Fear of current or ex partner: Not on file    Emotionally abused: Not on file    Physically abused: Not on file    Forced sexual activity: Not on file  Other Topics Concern   Not on file  Social History Narrative   Right handed    Lives with husband   Caffeine use: 2 cups per day (coffee/tea)     PHYSICAL EXAM  Vitals:   09/09/19 1014  BP:  122/86  Pulse: 100  Temp: 97.8 F (36.6 C)  Weight: 159 lb (72.1 kg)  Height: 5\' 3"  (1.6 m)    Body mass index is 28.17 kg/m.   General: The patient is well-developed and well-nourished and in no acute distress.  Tenderness in multiple points including many of the fibromyalgia tender points.  She has palpable nodules laterally in the lumbar region.  These might be lipomas.  HEENT:  Head is Delta/AT.  Sclera are anicteric.  Funduscopic exam shows normal optic discs and retinal vessels.  Neck: No carotid bruits are noted.  The neck is tender.  Cardiovascular: The heart has a regular rate and rhythm with a normal S1 and S2. There were no murmurs, gallops or rubs.    Skin: Extremities are without rash or  edema.  Musculoskeletal: Tenderness with palpation and paraspinal region in many muscles and joints.  Passive range of motion appear normal.  Neurologic Exam  Mental status: The patient is alert and oriented x 3 at the time of the examination. The patient has apparent normal recent and remote memory, with an apparently normal attention span and concentration ability.   Speech is normal.  Cranial nerves: Extraocular movements are full. Pupils are equal, round, and reactive to light and accomodation.  Visual fields are full.  Facial symmetry is present. There is good facial sensation to soft touch bilaterally.Facial strength is normal.  Trapezius and sternocleidomastoid strength is normal.  Speech was clear most of the time though she had some fluctuations.   No obvious hearing deficits are noted.  Motor:  There was functional weakness, improved with distraction.  Muscle bulk is normal.   Tone is normal. Strength is  5 / 5 on the right and at least 4+/5 and probably 5/5 on the left..   Sensory: She reported reduced sensation to temperature, soft touch and vibration sensation on the left arm and leg  Coordination: Cerebellar testing reduced left good finger-nose-finger and left  heel-to-shin    Gait and station: Station is normal.   When formally tested, the  gait was wide with a reduced stride.  Gait was better later during the visit.  Romberg is negative.   Reflexes: Deep tendon reflexes are symmetric and normal bilaterally.   Plantar responses are flexor.    DIAGNOSTIC DATA (LABS, IMAGING, TESTING) - I reviewed patient records, labs, notes, testing and imaging myself where available.  Lab Results  Component Value Date   WBC 3.5 02/18/2019   HGB 14.6 02/18/2019   HCT 42.1 02/18/2019   MCV 90 02/18/2019   PLT 221 02/18/2019      Component Value Date/Time   NA 136 10/15/2017 0133   NA 139 03/04/2014 1317   K 3.7 10/15/2017 0133   K 4.2 03/04/2014 1317   CL 103 10/15/2017 0133   CL 109 (H) 03/04/2014 1317   CO2 23 10/15/2017 0133   CO2 27 03/04/2014 1317   GLUCOSE 103 (H) 10/15/2017 0133   GLUCOSE 78 03/04/2014 1317   BUN 8 10/15/2017 0133   BUN 7 03/04/2014 1317   CREATININE 0.82 10/15/2017 0133   CREATININE 0.80 03/04/2014 1317   CALCIUM 9.0 10/15/2017 0133   CALCIUM 8.7 03/04/2014 1317   PROT 8.5 (H) 10/15/2017 0133   PROT 6.9 06/27/2012 1758   ALBUMIN 4.6 10/15/2017 0133   ALBUMIN 3.5 06/27/2012 1758   AST 29 10/15/2017 0133   AST 25 06/27/2012 1758   ALT 33 10/15/2017 0133   ALT 30 06/27/2012 1758   ALKPHOS 91 10/15/2017 0133   ALKPHOS 115 06/27/2012 1758   BILITOT 1.0 10/15/2017 0133   BILITOT 0.6 06/27/2012 1758   GFRNONAA >60 10/15/2017 0133   GFRNONAA >60 03/04/2014 1317   GFRAA >60 10/15/2017 0133   GFRAA >60 03/04/2014 1317   No results found for: CHOL, HDL, LDLCALC, LDLDIRECT, TRIG, CHOLHDL Lab Results  Component Value Date   HGBA1C 4.6 08/07/2016   Lab Results  Component Value Date   VITAMINB12 381 02/18/2019   Lab Results  Component Value Date   TSH 0.992 02/18/2019       ASSESSMENT AND PLAN  Numbness - Plan: MR BRAIN W WO CONTRAST, MR CERVICAL SPINE W WO CONTRAST  Fibromyalgia - Plan: Sedimentation rate, ANA w/Reflex,  Rheumatoid factor  Chronic pain of multiple joints - Plan: Sedimentation rate, ANA w/Reflex, Rheumatoid factor  Chronic fatigue - Plan: Sedimentation rate, ANA w/Reflex, Rheumatoid factor  Generalized anxiety disorder  Bipolar 2 disorder (HCC)  Vision disturbance   In summary, Shannon Campbell is a 43 year old woman with multiple neurologic and somatic complaints.  She has had fluctuating symptoms for the most part involving vision, gait, balance and left-sided sensation and strength.  There was a fair amount of fluctuation on examination consistent with some functional overlay, perhaps related to anxiety or other mood disturbance.  However, due to her symptoms we do need to rule out a CNS process and I will check an MRI of the brain and cervical spine to determine if she has any demyelinating , ischemic or other lesions in the brain or intrinsic or extrinsic myelopathy in the spine that might explain her symptoms.  We will also check blood work for autoimmune disorders.  Further evaluation may be necessary based on the results.  If the evaluation is mostly normal, consider referral to physical therapy.  Some of her symptoms may also be related to fibromyalgia and would consider starting medication if the results are negative.  She will return to see me in 3 months or sooner if there are new or worsening neurologic symptoms.  Thank you for asking me to see Shannon Campbell.  Please let me know if I can be of further assistance with her or other patients in the future.   Kitt Minardi A. Epimenio FootSater, MD, [facility],FAAN 09/09/2019, 10:35 AM Certified in Neurology, Clinical Neurophysiology, Sleep Medicine and Neuroimaging  [facility] [address], [address] [place], [place] [address] [phone]

## 2019-09-09 NOTE — Telephone Encounter (Signed)
[employer] medicare/medicaid order sent to [facility]. No auth they will reach out to the patient to schedule.  °

## 2019-09-10 LAB — ANA W/REFLEX: Anti Nuclear Antibody (ANA): NEGATIVE

## 2019-09-10 LAB — SEDIMENTATION RATE: Sed Rate: 2 mm/hr (ref 0–32)

## 2019-09-10 LAB — RHEUMATOID FACTOR: Rheumatoid fact SerPl-aCnc: <10 IU/mL (ref 0.0–13.9)

## 2019-09-11 ENCOUNTER — Telehealth: Payer: Self-pay | Admitting: *Deleted

## 2019-09-11 NOTE — Telephone Encounter (Signed)
-----   Message from Britt Bottom, MD sent at 09/10/2019  6:22 PM EDT ----- Please let her know that the lab work was normal.  We will let her know the results of the MRIs are after they are done.

## 2019-10-01 ENCOUNTER — Ambulatory Visit: Admission: RE | Admit: 2019-10-01 | Payer: Medicare Other | Source: Ambulatory Visit

## 2019-10-01 ENCOUNTER — Ambulatory Visit
Admission: RE | Admit: 2019-10-01 | Discharge: 2019-10-01 | Disposition: A | Discharge status: Home / Self Care | Payer: Medicare Other | Source: Ambulatory Visit

## 2019-10-01 ENCOUNTER — Ambulatory Visit
Admission: RE | Admit: 2019-10-01 | Discharge: 2019-10-01 | Disposition: A | Discharge status: Home / Self Care | Payer: Medicare Other | Source: Ambulatory Visit | Attending: Neurology | Admitting: Neurology

## 2019-10-01 ENCOUNTER — Other Ambulatory Visit: Payer: Self-pay | Admitting: Neurology

## 2019-10-01 DIAGNOSIS — R200 Anesthesia of skin: Secondary | ICD-10-CM | POA: Diagnosis not present

## 2019-10-01 DIAGNOSIS — R202 Paresthesia of skin: Secondary | ICD-10-CM | POA: Diagnosis not present

## 2019-10-01 MED ORDER — GADOBENATE DIMEGLUMINE 529 MG/ML IV SOLN
15.0000 mL | Freq: Once | INTRAVENOUS | Status: AC | PRN
  Administered 2019-10-01: 17:00:00 15 mL via INTRAVENOUS

## 2019-10-02 ENCOUNTER — Telehealth: Payer: Self-pay | Admitting: *Deleted

## 2019-10-02 NOTE — Telephone Encounter (Signed)
-----   Message from Britt Bottom, MD sent at 10/02/2019  8:29 AM EDT ----- Please let her know the MRI of the cervical spine showed a very small disc bulge at one level --- should not cause symptoms  MRi of the brain showed one small spot with old blood --- No evidence of MS.  This finding should not cause any symptoms

## 2019-10-30 ENCOUNTER — Other Ambulatory Visit (HOSPITAL_COMMUNITY)
Admission: RE | Admit: 2019-10-30 | Discharge: 2019-10-30 | Disposition: A | Discharge status: Home / Self Care | Payer: Medicare Other | Source: Ambulatory Visit | Attending: Obstetrics and Gynecology | Admitting: Obstetrics and Gynecology

## 2019-10-30 ENCOUNTER — Other Ambulatory Visit: Payer: Self-pay

## 2019-10-30 ENCOUNTER — Encounter: Payer: Self-pay | Admitting: Obstetrics and Gynecology

## 2019-10-30 ENCOUNTER — Ambulatory Visit (INDEPENDENT_AMBULATORY_CARE_PROVIDER_SITE_OTHER): Payer: Medicare Other | Admitting: Obstetrics and Gynecology

## 2019-10-30 VITALS — BP 105/67 | HR 105 | Ht 63.0 in | Wt 159.4 lb

## 2019-10-30 DIAGNOSIS — Z01419 Encounter for gynecological examination (general) (routine) without abnormal findings: Secondary | ICD-10-CM

## 2019-10-30 DIAGNOSIS — Z90710 Acquired absence of both cervix and uterus: Secondary | ICD-10-CM | POA: Diagnosis not present

## 2019-10-30 DIAGNOSIS — N898 Other specified noninflammatory disorders of vagina: Secondary | ICD-10-CM

## 2019-10-30 DIAGNOSIS — Z1151 Encounter for screening for human papillomavirus (HPV): Secondary | ICD-10-CM | POA: Insufficient documentation

## 2019-10-30 DIAGNOSIS — N951 Menopausal and female climacteric states: Secondary | ICD-10-CM

## 2019-10-30 DIAGNOSIS — R102 Pelvic and perineal pain: Secondary | ICD-10-CM | POA: Diagnosis not present

## 2019-10-30 DIAGNOSIS — N83202 Unspecified ovarian cyst, left side: Secondary | ICD-10-CM

## 2019-10-30 DIAGNOSIS — Z1231 Encounter for screening mammogram for malignant neoplasm of breast: Secondary | ICD-10-CM | POA: Diagnosis not present

## 2019-10-30 NOTE — Progress Notes (Signed)
Patient ID: Shannon Campbell, female   DOB: [birth year], 43 y.o.   MRN: [id number]  Pt is present today for annual exam. Pt's LMP had a hysterectomy.  Pt 's last mammogram 01/21/19 results negative. Pt had flu vaccine completed on 09/29/19.  Pt stated having vaginal dryness and pain during sexual intercourse and left ovarian cyst pain.

## 2019-10-30 NOTE — Patient Instructions (Signed)
Health Maintenance, Female Adopting a healthy lifestyle and getting preventive care are important in promoting health and wellness. Ask your health care provider about:  The right schedule for you to have regular tests and exams.  Things you can do on your own to prevent diseases and keep yourself healthy. What should I know about diet, weight, and exercise? Eat a healthy diet   Eat a diet that includes plenty of vegetables, fruits, low-fat dairy products, and lean protein.  Do not eat a lot of foods that are high in solid fats, added sugars, or sodium. Maintain a healthy weight Body mass index (BMI) is used to identify weight problems. It estimates body fat based on height and weight. Your health care provider can help determine your BMI and help you achieve or maintain a healthy weight. Get regular exercise Get regular exercise. This is one of the most important things you can do for your health. Most adults should:  Exercise for at least 150 minutes each week. The exercise should increase your heart rate and make you sweat (moderate-intensity exercise).  Do strengthening exercises at least twice a week. This is in addition to the moderate-intensity exercise.  Spend less time sitting. Even light physical activity can be beneficial. Watch cholesterol and blood lipids Have your blood tested for lipids and cholesterol at 41 years of age, then have this test every 5 years. Have your cholesterol levels checked more often if:  Your lipid or cholesterol levels are high.  You are older than 41 years of age.  You are at high risk for heart disease. What should I know about cancer screening? Depending on your health history and family history, you may need to have cancer screening at various ages. This may include screening for:  Breast cancer.  Cervical cancer.  Colorectal cancer.  Skin cancer.  Lung cancer. What should I know about heart disease, diabetes, and high blood  pressure? Blood pressure and heart disease  High blood pressure causes heart disease and increases the risk of stroke. This is more likely to develop in people who have high blood pressure readings, are of African descent, or are overweight.  Have your blood pressure checked: ? Every 3-5 years if you are 68-56 years of age. ? Every year if you are 44 years old or older. Diabetes Have regular diabetes screenings. This checks your fasting blood sugar level. Have the screening done:  Once every three years after age 35 if you are at a normal weight and have a low risk for diabetes.  More often and at a younger age if you are overweight or have a high risk for diabetes. What should I know about preventing infection? Hepatitis B If you have a higher risk for hepatitis B, you should be screened for this virus. Talk with your health care provider to find out if you are at risk for hepatitis B infection. Hepatitis C Testing is recommended for:  Everyone born from [birth year] through 1963.  Anyone with known risk factors for hepatitis C. Sexually transmitted infections (STIs)  Get screened for STIs, including gonorrhea and chlamydia, if: ? You are sexually active and are younger than 41 years of age. ? You are older than 41 years of age and your health care provider tells you that you are at risk for this type of infection. ? Your sexual activity has changed since you were last screened, and you are at increased risk for chlamydia or gonorrhea. Ask your health care provider if  you are at risk.  Ask your health care provider about whether you are at high risk for HIV. Your health care provider may recommend a prescription medicine to help prevent HIV infection. If you choose to take medicine to prevent HIV, you should first get tested for HIV. You should then be tested every 3 months for as long as you are taking the medicine. Pregnancy  If you are about to stop having your period (premenopausal) and  you may become pregnant, seek counseling before you get pregnant.  Take 400 to 800 micrograms (mcg) of folic acid every day if you become pregnant.  Ask for birth control (contraception) if you want to prevent pregnancy. Osteoporosis and menopause Osteoporosis is a disease in which the bones lose minerals and strength with aging. This can result in bone fractures. If you are 59 years old or older, or if you are at risk for osteoporosis and fractures, ask your health care provider if you should:  Be screened for bone loss.  Take a calcium or vitamin D supplement to lower your risk of fractures.  Be given hormone replacement therapy (HRT) to treat symptoms of menopause. Follow these instructions at home: Lifestyle  Do not use any products that contain nicotine or tobacco, such as cigarettes, e-cigarettes, and chewing tobacco. If you need help quitting, ask your health care provider.  Do not use street drugs.  Do not share needles.  Ask your health care provider for help if you need support or information about quitting drugs. Alcohol use  Do not drink alcohol if: ? Your health care provider tells you not to drink. ? You are pregnant, may be pregnant, or are planning to become pregnant.  If you drink alcohol: ? Limit how much you use to 0-1 drink a day. ? Limit intake if you are breastfeeding.  Be aware of how much alcohol is in your drink. In the [place] one drink equals one 12 oz bottle of beer (355 mL), one 5 oz glass of wine (148 mL), or one 1 oz glass of hard liquor (44 mL). General instructions  Schedule regular health, dental, and eye exams.  Stay current with your vaccines.  Tell your health care provider if: ? You often feel depressed. ? You have ever been abused or do not feel safe at home. Summary  Adopting a healthy lifestyle and getting preventive care are important in promoting health and wellness.  Follow your health care provider's instructions about healthy  diet, exercising, and getting tested or screened for diseases.  Follow your health care provider's instructions on monitoring your cholesterol and blood pressure. This information is not intended to replace advice given to you by your health care provider. Make sure you discuss any questions you have with your health care provider. Document Released: 06/26/2011 Document Revised: 12/04/2018 Document Reviewed: 12/04/2018 Elsevier Patient Education  2020 [employer]   Breast Self-Awareness Breast self-awareness is knowing how your breasts look and feel. Doing breast self-awareness is important. It allows you to catch a breast problem early while it is still small and can be treated. All women should do breast self-awareness, including women who have had breast implants. Tell your doctor if you notice a change in your breasts. What you need:  A mirror.  A well-lit room. How to do a breast self-exam A breast self-exam is one way to learn what is normal for your breasts and to check for changes. To do a breast self-exam: Look for changes  1.  Take off all the clothes above your waist. 2. Stand in front of a mirror in a room with good lighting. 3. Put your hands on your hips. 4. Push your hands down. 5. Look at your breasts and nipples in the mirror to see if one breast or nipple looks different from the other. Check to see if: ? The shape of one breast is different. ? The size of one breast is different. ? There are wrinkles, dips, and bumps in one breast and not the other. 6. Look at each breast for changes in the skin, such as: ? Redness. ? Scaly areas. 7. Look for changes in your nipples, such as: ? Liquid around the nipples. ? Bleeding. ? Dimpling. ? Redness. ? A change in where the nipples are. Feel for changes  1. Lie on your back on the floor. 2. Feel each breast. To do this, follow these steps: ? Pick a breast to feel. ? Put the arm closest to that breast above your  head. ? Use your other arm to feel the nipple area of your breast. Feel the area with the pads of your three middle fingers by making small circles with your fingers. For the first circle, press lightly. For the second circle, press harder. For the third circle, press even harder. ? Keep making circles with your fingers at the different pressures as you move down your breast. Stop when you feel your ribs. ? Move your fingers a little toward the center of your body. ? Start making circles with your fingers again, this time going up until you reach your collarbone. ? Keep making up-and-down circles until you reach your armpit. Remember to keep using the three pressures. ? Feel the other breast in the same way. 3. Sit or stand in the tub or shower. 4. With soapy water on your skin, feel each breast the same way you did in step 2 when you were lying on the floor. Write down what you find Writing down what you find can help you remember what to tell your doctor. Write down:  What is normal for each breast.  Any changes you find in each breast, including: ? The kind of changes you find. ? Whether you have pain. ? Size and location of any lumps.  When you last had your menstrual period. General tips  Check your breasts every month.  If you are breastfeeding, the best time to check your breasts is after you feed your baby or after you use a breast pump.  If you get menstrual periods, the best time to check your breasts is 5-7 days after your menstrual period is over.  With time, you will become comfortable with the self-exam, and you will begin to know if there are changes in your breasts. Contact a doctor if you:  See a change in the shape or size of your breasts or nipples.  See a change in the skin of your breast or nipples, such as red or scaly skin.  Have fluid coming from your nipples that is not normal.  Find a lump or thick area that was not there before.  Have pain in your  breasts.  Have any concerns about your breast health. Summary  Breast self-awareness includes looking for changes in your breasts, as well as feeling for changes within your breasts.  Breast self-awareness should be done in front of a mirror in a well-lit room.  You should check your breasts every month. If you get menstrual  periods, the best time to check your breasts is 5-7 days after your menstrual period is over.  Let your doctor know of any changes you see in your breasts, including changes in size, changes on the skin, pain or tenderness, or fluid from your nipples that is not normal. This information is not intended to replace advice given to you by your health care provider. Make sure you discuss any questions you have with your health care provider. Document Released: 05/29/2008 Document Revised: 07/30/2018 Document Reviewed: 07/30/2018 Elsevier Patient Education  2020 [employer]    Perimenopause  Perimenopause is the normal time of life before and after menstrual periods stop completely (menopause). Perimenopause can begin 2-8 years before menopause, and it usually lasts for 1 year after menopause. During perimenopause, the ovaries may or may not produce an egg. What are the causes? This condition is caused by a natural change in hormone levels that happens as you get older. What increases the risk? This condition is more likely to start at an earlier age if you have certain medical conditions or treatments, including:  A tumor of the pituitary gland in the brain.  A disease that affects the ovaries and hormone production.  Radiation treatment for cancer.  Certain cancer treatments, such as chemotherapy or hormone (anti-estrogen) therapy.  Heavy smoking and excessive alcohol use.  Family history of early menopause. What are the signs or symptoms? Perimenopausal changes affect each woman differently. Symptoms of this condition may include:  Hot flashes.  Night  sweats.  Irregular menstrual periods.  Decreased sex drive.  Vaginal dryness.  Headaches.  Mood swings.  Depression.  Memory problems or trouble concentrating.  Irritability.  Tiredness.  Weight gain.  Anxiety.  Trouble getting pregnant. How is this diagnosed? This condition is diagnosed based on your medical history, a physical exam, your age, your menstrual history, and your symptoms. Hormone tests may also be done. How is this treated? In some cases, no treatment is needed. You and your health care provider should make a decision together about whether treatment is necessary. Treatment will be based on your individual condition and preferences. Various treatments are available, such as:  Menopausal hormone therapy (MHT).  Medicines to treat specific symptoms.  Acupuncture.  Vitamin or herbal supplements. Before starting treatment, make sure to let your health care provider know if you have a personal or family history of:  Heart disease.  Breast cancer.  Blood clots.  Diabetes.  Osteoporosis. Follow these instructions at home: Lifestyle  Do not use any products that contain nicotine or tobacco, such as cigarettes and e-cigarettes. If you need help quitting, ask your health care provider.  Eat a balanced diet that includes fresh fruits and vegetables, whole grains, soybeans, eggs, lean meat, and low-fat dairy.  Get at least 30 minutes of physical activity on 5 or more days each week.  Avoid alcoholic and caffeinated beverages, as well as spicy foods. This may help prevent hot flashes.  Get 7-8 hours of sleep each night.  Dress in layers that can be removed to help you manage hot flashes.  Find ways to manage stress, such as deep breathing, meditation, or journaling. General instructions  Keep track of your menstrual periods, including: ? When they occur. ? How heavy they are and how long they last. ? How much time passes between periods.  Keep  track of your symptoms, noting when they start, how often you have them, and how long they last.  Take over-the-counter and prescription  medicines only as told by your health care provider.  Take vitamin supplements only as told by your health care provider. These may include calcium, vitamin E, and vitamin D.  Use vaginal lubricants or moisturizers to help with vaginal dryness and improve comfort during sex.  Talk with your health care provider before starting any herbal supplements.  Keep all follow-up visits as told by your health care provider. This is important. This includes any group therapy or counseling. Contact a health care provider if:  You have heavy vaginal bleeding or pass blood clots.  Your period lasts more than 2 days longer than normal.  Your periods are recurring sooner than 21 days.  You bleed after having sex. Get help right away if:  You have chest pain, trouble breathing, or trouble talking.  You have severe depression.  You have pain when you urinate.  You have severe headaches.  You have vision problems. Summary  Perimenopause is the time when a woman's body begins to move into menopause. This may happen naturally or as a result of other health problems or medical treatments.  Perimenopause can begin 2-8 years before menopause, and it usually lasts for 1 year after menopause.  Perimenopausal symptoms can be managed through medicines, lifestyle changes, and complementary therapies such as acupuncture. This information is not intended to replace advice given to you by your health care provider. Make sure you discuss any questions you have with your health care provider. Document Released: 01/18/2005 Document Revised: 11/23/2017 Document Reviewed: 01/16/2017 Elsevier Patient Education  2020 [employer].

## 2019-10-30 NOTE — Progress Notes (Signed)
GYNECOLOGY ANNUAL PHYSICAL EXAM PROGRESS NOTE  Subjective:    Shannon Campbell is a 43 y.o. G77P0102 female who presents to re-establish care and for an annual exam. She was previously seen at [facility] in 2017.  Notes that she moved to [place] but relocated back here this year after reconciling with her husband. The patient has no complaints today. The patient is sexually active. The patient wears seatbelts: yes. The patient participates in regular exercise: yes (3x weekly). 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 The patient reports that there is not domestic violence in her life.    1. Patient reports complaints of vaginal dryness and dyspareunia for "quite some time.  2. She also reports complaints of left ovarian cyst pain.  Patient has had an ovarian cyst for several years. Notes that she had a recent scan several months ago that notes the cyst was getting bigger.   Gynecologic History  Menarche age: 39 Patient's last menstrual period was 12/26/2011 (lmp unknown). Is s/p hysterectomy. Contraception: status post hysterectomy History of STI's: Reports history, declines further info Last Pap: 2017.  Notes h/o abnormal pap smears. Last mammogram: 12/2018. Results were: normal   OB History  Gravida Para Term Preterm AB Living  1 1 0 1 0 2  SAB TAB Ectopic Multiple Live Births  0 0 0 1 2    # Outcome Date GA Lbr Len/2nd Weight Sex Delivery Anes PTL Lv  1A Preterm 1999 [redacted]w[redacted]d  3 lb (1.361 kg) M Vag-Spont   LIV  1B Preterm 1999 [redacted]w[redacted]d  2 lb (0.907 kg) M Vag-Spont   LIV    Past Medical History:  Diagnosis Date  . Acute cholecystitis 12/11/2016  . Anxiety   . Asthma   . Calculus of gallbladder with acute cholecystitis without obstruction 12/11/2016  . Chicken pox 07/08/2015  . Depression   . Fibromyalgia   . History of cardiac arrest   . Pap smear abnormality of cervix with LGSIL   . Substance abuse ([place])   . VAIN (vaginal intraepithelial  neoplasia)     Past Surgical History:  Procedure Laterality Date  . ABDOMINAL HYSTERECTOMY  2013  . BREAST EXCISIONAL BIOPSY Left 2005   benign  . CESAREAN SECTION  1999  . CHOLECYSTECTOMY N/A 12/12/2016   Procedure: LAPAROSCOPIC CHOLECYSTECTOMY;  Surgeon: Clayburn Pert, MD;  Location: [facility] ORS;  Service: General;  Laterality: N/A;  . COLONOSCOPY WITH PROPOFOL N/A 02/24/2019   Procedure: COLONOSCOPY WITH PROPOFOL;  Surgeon: Lin Landsman, MD;  Location: [facility] ENDOSCOPY;  Service: Gastroenterology;  Laterality: N/A;  . ESOPHAGOGASTRODUODENOSCOPY (EGD) WITH PROPOFOL N/A 02/24/2019   Procedure: ESOPHAGOGASTRODUODENOSCOPY (EGD) WITH PROPOFOL;  Surgeon: Lin Landsman, MD;  Location: [facility] ENDOSCOPY;  Service: Gastroenterology;  Laterality: N/A;  . HERNIA REPAIR  2002  . NASAL SINUS SURGERY    . WRIST SURGERY      Family History  Problem Relation Age of Onset  . Thyroid disease Mother   . Hypertension Father   . Hemochromatosis Father   . Breast cancer Maternal Grandmother     Social History   Socioeconomic History  . Marital status: Married    Spouse name: Not on file  . Number of children: 2  . Years of education: 83  . Highest education level: Not on file  Occupational History  . Occupation: Disability  Social Needs  . Financial resource strain: Not on file  . Food insecurity    Worry: Not on file  Inability: Not on file  . Transportation needs    Medical: Not on file    Non-medical: Not on file  Tobacco Use  . Smoking status: Current Every Day Smoker    Packs/day: 1.00    Years: 15.00    Pack years: 15.00    Types: Cigarettes    Start date: 10/11/2014  . Smokeless tobacco: Never Used  . Tobacco comment: nicotine  Substance and Sexual Activity  . Alcohol use: No    Comment: in recovery for 3.5 years; last used ETOH 1 month ago  . Drug use: Yes    Types: "Crack" cocaine, Heroin, Cocaine    Comment: in recovery for 3.5 years; Last used drugs 1 month  ago  . Sexual activity: Yes    Birth control/protection: Other-see comments    Comment: hysterectomy  Lifestyle  . Physical activity    Days per week: Not on file    Minutes per session: Not on file  . Stress: Not on file  Relationships  . Social Musician on phone: Not on file    Gets together: Not on file    Attends religious service: Not on file    Active member of club or organization: Not on file    Attends meetings of clubs or organizations: Not on file    Relationship status: Not on file  . Intimate partner violence    Fear of current or ex partner: Not on file    Emotionally abused: Not on file    Physically abused: Not on file    Forced sexual activity: Not on file  Other Topics Concern  . Not on file  Social History Narrative   Right handed    Lives with husband   Caffeine use: 2 cups per day (coffee/tea)    Current Outpatient Medications on File Prior to Visit  Medication Sig Dispense Refill  . albuterol (PROVENTIL HFA;VENTOLIN HFA) 108 (90 Base) MCG/ACT inhaler Inhale 1-2 puffs into the lungs every 6 (six) hours as needed for wheezing or shortness of breath. 1 Inhaler 0  . buPROPion (WELLBUTRIN XL) 300 MG 24 hr tablet TK 1 T PO QAM    . clonazePAM (KLONOPIN) 0.5 MG tablet Take 0.5 mg by mouth as needed for anxiety.    Marland Kitchen EPINEPHrine 0.3 mg/0.3 mL IJ SOAJ injection     . lamoTRIgine (LAMICTAL) 150 MG tablet     . traZODone (DESYREL) 50 MG tablet     . VYVANSE 60 MG capsule TK ONE C PO  QD    . [DISCONTINUED] levocetirizine (XYZAL) 5 MG tablet      No current facility-administered medications on file prior to visit.     Allergies  Allergen Reactions  . Prednisone Anaphylaxis and Other (See Comments)     Makes her moody Other reaction(s): Other (See Comments) Makes her moody     Review of Systems Constitutional: negative for chills, fatigue, fevers and sweats. Positive for hot flushes, occasional.  Eyes: negative for irritation, redness and  visual disturbance Ears, nose, mouth, throat, and face: negative for hearing loss, nasal congestion, snoring and tinnitus Respiratory: negative for asthma, cough, sputum Cardiovascular: negative for chest pain, dyspnea, exertional chest pressure/discomfort, irregular heart beat, palpitations and syncope Gastrointestinal: negative for abdominal pain, change in bowel habits, nausea and vomiting Genitourinary: negative for abnormal menstrual periods, genital lesions, sexual problems and vaginal discharge, dysuria and urinary incontinence Integument/breast: negative for breast lump, breast tenderness and nipple discharge Hematologic/lymphatic: negative for  bleeding and easy bruising Musculoskeletal:negative for back pain and muscle weakness Neurological: negative for dizziness, headaches, vertigo and weakness Endocrine: negative for diabetic symptoms including polydipsia, polyuria and skin dryness Allergic/Immunologic: negative for hay fever and urticaria       Objective:  Blood pressure 105/67, pulse (!) 105, height 5\' 3"  (1.6 m), weight 159 lb 6.4 oz (72.3 kg), last menstrual period 12/26/2011. Body mass index is 28.24 kg/m.  General Appearance:    Alert, cooperative, no distress, appears stated age, overweight  Head:    Normocephalic, without obvious abnormality, atraumatic  Eyes:    PERRL, conjunctiva/corneas clear, EOM's intact, both eyes  Ears:    Normal external ear canals, both ears  Nose:   Nares normal, septum midline, mucosa normal, no drainage or sinus tenderness  Throat:   Lips, mucosa, and tongue normal; teeth and gums normal  Neck:   Supple, symmetrical, trachea midline, no adenopathy; thyroid: no enlargement/tenderness/nodules; no carotid bruit or JVD  Back:     Symmetric, no curvature, ROM normal, no CVA tenderness  Lungs:     Clear to auscultation bilaterally, respirations unlabored  Chest Wall:    No tenderness or deformity   Heart:    Regular rate and rhythm, S1 and S2  normal, no murmur, rub or gallop  Breast Exam:    No tenderness, masses, or nipple abnormality  Abdomen:     Soft, mildly tender in lower abdomen, bowel sounds active all four quadrants, no masses, no organomegaly.    Genitalia:    Pelvic:external genitalia normal, vagina without lesions, discharge, or tenderness, rectovaginal septum  normal. Cervix normal in appearance, no cervical motion tenderness, no adnexal masses or tenderness.  Uterus normal size, shape, mobile, regular contours, nontender.  Rectal:    Normal external sphincter.  No hemorrhoids appreciated. Internal exam not done.   Extremities:   Extremities normal, atraumatic, no cyanosis or edema  Pulses:   2+ and symmetric all extremities  Skin:   Skin color, texture, turgor normal, no rashes or lesions  Lymph nodes:   Cervical, supraclavicular, and axillary nodes normal  Neurologic:   CNII-XII intact, normal strength, sensation and reflexes throughout    Labs:  Lab Results  Component Value Date   WBC 3.5 02/18/2019   HGB 14.6 02/18/2019   HCT 42.1 02/18/2019   MCV 90 02/18/2019   PLT 221 02/18/2019    Lab Results  Component Value Date   CREATININE 0.82 10/15/2017   BUN 8 10/15/2017   NA 136 10/15/2017   K 3.7 10/15/2017   CL 103 10/15/2017   CO2 23 10/15/2017    Lab Results  Component Value Date   ALT 33 10/15/2017   AST 29 10/15/2017   ALKPHOS 91 10/15/2017   BILITOT 1.0 10/15/2017    Lab Results  Component Value Date   TSH 0.992 02/18/2019    Labs also reviewed in [facility].   Assessment:   1. Encounter for well woman exam with routine gynecological exam   2. Breast cancer screening by mammogram   3. Perimenopausal symptoms   4. Pelvic pain   5. Left ovarian cyst   6. Vaginal dryness     Plan:     Blood tests: Labs reviewed, up to date. Breast self exam technique reviewed and patient encouraged to perform self-exam monthly. Contraception: status post hysterectomy. Discussed healthy  lifestyle modifications. Mammogram up to date. Continue routine screening.  Pap smear up to date.  Flu vaccine up to date. Vaginal dryness,  discussed possibility that patient may be nearing perimenopause. Offered to check hormone levels as patient is s/p hysterectomy and cannot rely on menstrual cycles. Discussed management options, including vaginal lubricants, moisturizers, local HRT, or systemic HRT as patient also noting some hot flushes. Patient ok to have labs drawn.  Will start local HRT with Imvexxy 4 mg for vaginal dryness.   Left ovarian cyst, patient notes has enlarged. Review of records note small dermoid cyst, ~ 2-3 cm. Patient reports that the cyst has gotten bigger based on a recent outside scan. Will get records. Discussed that if dermoid cyst continues to grow or cause more significant pain, can consider surgical removal.  RTC in 4-6 weeks to f/u vaginal dryness.  RTC in 1 year for annual exam.    Hildred Laserherry, Hughey Rittenberry, MD [facility]

## 2019-10-31 LAB — PROGESTERONE: Progesterone: 5 ng/mL

## 2019-10-31 LAB — ESTRADIOL: Estradiol: 183 pg/mL

## 2019-10-31 LAB — FSH/LH
FSH: 3.9 m[IU]/mL
LH: 8.8 m[IU]/mL

## 2019-11-01 ENCOUNTER — Encounter: Payer: Self-pay | Admitting: Obstetrics and Gynecology

## 2019-11-07 LAB — CYTOLOGY - PAP
Comment: NEGATIVE
Diagnosis: NEGATIVE
High risk HPV: NEGATIVE

## 2019-11-27 ENCOUNTER — Other Ambulatory Visit: Payer: Self-pay

## 2019-11-27 ENCOUNTER — Encounter: Payer: Self-pay | Admitting: Obstetrics and Gynecology

## 2019-11-27 ENCOUNTER — Other Ambulatory Visit: Payer: Self-pay | Admitting: Obstetrics and Gynecology

## 2019-11-27 ENCOUNTER — Ambulatory Visit (INDEPENDENT_AMBULATORY_CARE_PROVIDER_SITE_OTHER): Payer: Medicare Other | Admitting: Obstetrics and Gynecology

## 2019-11-27 ENCOUNTER — Encounter
Admission: RE | Admit: 2019-11-27 | Discharge: 2019-11-27 | Disposition: A | Discharge status: Home / Self Care | Payer: Medicare Other | Source: Ambulatory Visit | Attending: Obstetrics and Gynecology | Admitting: Obstetrics and Gynecology

## 2019-11-27 VITALS — BP 115/82 | HR 103 | Ht 63.0 in | Wt 159.2 lb

## 2019-11-27 DIAGNOSIS — R102 Pelvic and perineal pain: Secondary | ICD-10-CM | POA: Diagnosis not present

## 2019-11-27 DIAGNOSIS — R339 Retention of urine, unspecified: Secondary | ICD-10-CM

## 2019-11-27 DIAGNOSIS — N898 Other specified noninflammatory disorders of vagina: Secondary | ICD-10-CM

## 2019-11-27 DIAGNOSIS — N951 Menopausal and female climacteric states: Secondary | ICD-10-CM

## 2019-11-27 DIAGNOSIS — N83202 Unspecified ovarian cyst, left side: Secondary | ICD-10-CM | POA: Diagnosis not present

## 2019-11-27 DIAGNOSIS — Z87442 Personal history of urinary calculi: Secondary | ICD-10-CM

## 2019-11-27 DIAGNOSIS — R350 Frequency of micturition: Secondary | ICD-10-CM

## 2019-11-27 HISTORY — DX: Pneumonia, unspecified organism: J18.9

## 2019-11-27 HISTORY — DX: Gastro-esophageal reflux disease without esophagitis: K21.9

## 2019-11-27 MED ORDER — PREMPRO 0.45-1.5 MG PO TABS: 1.0000 | ORAL_TABLET | Freq: Every day | ORAL | 1 refills | Status: DC

## 2019-11-27 MED ORDER — TAMSULOSIN HCL 0.4 MG PO CAPS: 0.4000 mg | ORAL_CAPSULE | Freq: Every day | ORAL | 1 refills | Status: DC

## 2019-11-27 NOTE — Patient Instructions (Signed)
Your procedure is scheduled on: Mon. 12/7  Report to Day Surgery. To find out your arrival time please call [phone] between 1PM - 3PM on Friday 12/4.  Remember: Instructions that are not followed completely may result in serious medical risk,  up to and including death, or upon the discretion of your surgeon and anesthesiologist your  surgery may need to be rescheduled.     _X__ 1. Do not eat food after midnight the night before your procedure.                 No gum chewing or hard candies. You may drink clear liquids up to 2 hours                 before you are scheduled to arrive for your surgery- DO not drink clear                 liquids within 2 hours of the start of your surgery.                 Clear Liquids include:  water, apple juice without pulp, clear carbohydrate                 drink such as Clearfast of Gatorade, Black Coffee or Tea (Do not add                 anything to coffee or tea).  __X__2.  On the morning of surgery brush your teeth with toothpaste and water, you                may rinse your mouth with mouthwash if you wish.  Do not swallow any toothpaste of mouthwash.     ___ 3.  No Alcohol for 24 hours before or after surgery.   _X__ 4.  Do Not Smoke or use e-cigarettes For 24 Hours Prior to Your Surgery.                 Do not use any chewable tobacco products for at least 6 hours prior to                 surgery.  ____  5.  Bring all medications with you on the day of surgery if instructed.   __x__  6.  Notify your doctor if there is any change in your medical condition      (cold, fever, infections).     Do not wear jewelry, make-up, hairpins, clips or nail polish. Do not wear lotions, powders, or perfumes. You may wear deodorant. Do not shave 48 hours prior to surgery. Men may shave face and neck. Do not bring valuables to the hospital.    [facility] is not responsible for any belongings or valuables.  Contacts,  dentures or bridgework may not be worn into surgery. Leave your suitcase in the car. After surgery it may be brought to your room. For patients admitted to the hospital, discharge time is determined by your treatment team.   Patients discharged the day of surgery will not be allowed to drive home.   Please read over the following fact sheets that you were given:   __x__ Take these medicines the morning of surgery with A SIP OF WATER:    1. buPROPion (WELLBUTRIN XL) 300 MG 24 hr tablet  2. clonazePAM (KLONOPIN) 0.5 MG tablet if needed  3. lamoTRIgine (LAMICTAL) 150 MG tablet  4.tamsulosin (FLOMAX) 0.4 MG CAPS capsule  5.  6.  ____ Fleet Enema (as directed)   _x___ Use CHG Soap as directed  __x__ Use inhalers on the day of surgery albuterol (PROVENTIL HFA;VENTOLIN HFA) 108 (90 Base) MCG/ACT inhaler and bring with you  ____ Stop metformin 2 days prior to surgery    ____ Take 1/2 of usual insulin dose the night before surgery. No insulin the morning          of surgery.   ____ Stop Coumadin/Plavix/aspirin on   __x__ Stop Anti-inflammatories no ibuprofen aleve or aspirin until after surgery   ____ Stop supplements until after surgery.    ____ Bring C-Pap to the hospital.

## 2019-11-27 NOTE — Progress Notes (Signed)
GYNECOLOGY PROGRESS NOTE  Subjective:    Patient ID: Shannon Campbell, female    DOB: 1976-02-13, 43 y.o.   MRN: [id number]  HPI  Patient is a 43 y.o. G78P0102 female who presents for 1 month f/u of suspected , dyspareunia, pelvic pain, and h/o left ovarian cyst.  Patient was given samples of Imvexxy 4 mg last visit. States that she has not noticed much difference in her symptoms.  Is also now noting vasomotor symptoms.  Notes that pelvic pain is not managed with Tylenol or Ibuprofen.   Of note, patient also complains of discomfort with urination, mostly in LLQ.  She reports having a history of kidney stones and UTI's from age [age]. Reports current LLQ pain feels like it did then.  Uses Azo which offers mild relief.  Pain is intermittent, and radiates from lower abdomen to her back. Also experiencing some urinary hesitancy and feelings of incomplete bladder emptying.    Past Surgical History:  Procedure Laterality Date  . ABDOMINAL HYSTERECTOMY  2013  . BREAST EXCISIONAL BIOPSY Left 2005   benign  . CESAREAN SECTION  1999  . CHOLECYSTECTOMY N/A 12/12/2016   Procedure: LAPAROSCOPIC CHOLECYSTECTOMY;  Surgeon: Clayburn Pert, MD;  Location: [facility] ORS;  Service: General;  Laterality: N/A;  . COLONOSCOPY WITH PROPOFOL N/A 02/24/2019   Procedure: COLONOSCOPY WITH PROPOFOL;  Surgeon: Lin Landsman, MD;  Location: [facility] ENDOSCOPY;  Service: Gastroenterology;  Laterality: N/A;  . ESOPHAGOGASTRODUODENOSCOPY (EGD) WITH PROPOFOL N/A 02/24/2019   Procedure: ESOPHAGOGASTRODUODENOSCOPY (EGD) WITH PROPOFOL;  Surgeon: Lin Landsman, MD;  Location: [facility] ENDOSCOPY;  Service: Gastroenterology;  Laterality: N/A;  . HERNIA REPAIR  2002  . NASAL SINUS SURGERY    . WRIST SURGERY       The following portions of the patient's history were reviewed and updated as appropriate: allergies, current medications, past family history, past medical history, past social history, past surgical history and  problem list.  Review of Systems Pertinent items noted in HPI and remainder of comprehensive ROS otherwise negative.     Objective:   Blood pressure 115/82, pulse (!) 103, height 5\' 3"  (1.6 m), weight 159 lb 3.2 oz (72.2 kg), last menstrual period 12/26/2011. General appearance: alert and no distress Abdomen: soft, non-tender; bowel sounds normal; no masses,  no organomegaly Pelvic: pelvic exam deferred, see exam from 10/30/2019.    Assessment:   Pelvic pain Vaginal dryness Left ovarian cyst Perimenopausal symptoms Urinary frequency Incomplete bladder emptying History of kidney stones  Plan:   1. Discussion had on patient's pelvic pain.  Currently with small left ovarian cyst (3 cm dermoid), has had for several years, however has grown slightly in size recently.  Patient has had mild symptoms over the years but gradually worsening to where OTC meds are no longer helping.  Patient does have a h/o substance abuse in the past, does not desire to to use anything stronger.  Discussion had that as symptoms have worsened, despite size of cyst, is an option to remove. Patient ok with plan, would like to have cyst removed. Discussed risks/benefits of surgery, including risk of oophorectomy if cystectomy could not be performed, bleeding, infection, need for further surgery. Patient notes understanding. Preop done today.  Will schedule surgery for 12/01/2019.  2. Perimenopausal symptoms. Will change from Imvexxy for local symptoms to systemic HRT with Premarin tabletes.  Discussed risks/benefits of HRT.  3. Urinary frequency, LLQ pain, incomplete bladder emptying and h/o kidney stones - will prescribe Flomax to see if  this will help symptoms. Also will refer to Urology.     A total of 25 minutes were spent face-to-face with the patient during this encounter and over half of that time involved counseling and coordination of care.   Hildred Laser, MD [facility]

## 2019-11-27 NOTE — Patient Instructions (Signed)
Diagnostic Laparoscopy Diagnostic laparoscopy is a procedure to diagnose diseases in the abdomen. It might be done for a variety of reasons, such as to look for scar tissue, cancer, or a reason for abdomen (abdominal) pain. During the procedure, a thin, flexible tube that has a light and a camera on the end (laparoscope) is inserted through an incision in the abdomen. The image from the camera is shown on a monitor to help your surgeon see inside your body. Tell a health care provider about:  Any allergies you have.  All medicines you are taking, including vitamins, herbs, eye drops, creams, and over-the-counter medicines.  Any problems you or family members have had with anesthetic medicines.  Any blood disorders you have.  Any surgeries you have had.  Any medical conditions you have. What are the risks? Generally, this is a safe procedure. However, problems may occur, including:  Infection.  Bleeding.  Allergic reactions to medicines or dyes.  Damage to abdominal structures or organs, such as the intestines, liver, stomach, or spleen. What happens before the procedure? Medicines  Ask your health care provider about: ? Changing or stopping your regular medicines. This is especially important if you are taking diabetes medicines or blood thinners. ? Taking medicines such as aspirin and ibuprofen. These medicines can thin your blood. Do not take these medicines unless your health care provider tells you to take them. ? Taking over-the-counter medicines, vitamins, herbs, and supplements.  You may be given antibiotic medicine to help prevent infection. Staying hydrated Follow instructions from your health care provider about hydration, which may include:  Up to 2 hours before the procedure - you may continue to drink clear liquids, such as water, clear fruit juice, black coffee, and plain tea. Eating and drinking restrictions Follow instructions from your health care provider  about eating and drinking, which may include:  8 hours before the procedure - stop eating heavy meals or foods such as meat, fried foods, or fatty foods.  6 hours before the procedure - stop eating light meals or foods, such as toast or cereal.  6 hours before the procedure - stop drinking milk or drinks that contain milk.  2 hours before the procedure - stop drinking clear liquids. General instructions  Ask your health care provider how your surgical site will be marked or identified.  You may be asked to shower with a germ-killing soap.  Plan to have someone take you home from the hospital or clinic.  Plan to have a responsible adult care for you for at least 24 hours after you leave the hospital or clinic. This is important. What happens during the procedure?   To lower your risk of infection: ? Your health care team will wash or sanitize their hands. ? Hair may be removed from the surgical area. ? Your skin will be washed with soap.  An IV will be inserted into one of your veins.  You will be given a medicine to make you fall asleep (general anesthetic). You may also be given a medicine to help you relax (sedative).  A breathing tube will be placed down your throat to help you breathe during the procedure.  Your abdomen will be filled with an air-like gas so it expands. This will give the surgeon more room to operate and will make your organs easier to see.  Many small incisions will be made in your abdomen.  A laparoscope and other surgical instruments will be inserted into your abdomen through the   incisions.  A tissue sample may be removed from an organ for examination (biopsy). This will depend on the reason why you are having this procedure.  The laparoscope and other instruments will be removed from your abdomen.  The gas will be released.  Your incisions will be closed with stitches (sutures) and covered with a bandage (dressing).  Your breathing tube will be  removed. The procedure may vary among health care providers and hospitals. What happens after the procedure?   Your blood pressure, heart rate, breathing rate, and blood oxygen level will be monitored until the medicines you were given have worn off.  Do not drive for 24 hours if you were given a sedative during your procedure.  It is up to you to get the results of your procedure. Ask your health care provider, or the department that is doing the procedure, when your results will be ready. Summary  Diagnostic laparoscopy is a way to look for problems in the abdomen using small incisions.  Follow instructions from your health care provider about how to prepare for the procedure.  Plan to have a responsible adult care for you for at least 24 hours after you leave the hospital or clinic. This is important. This information is not intended to replace advice given to you by your health care provider. Make sure you discuss any questions you have with your health care provider. Document Released: 03/19/2001 Document Revised: 11/23/2017 Document Reviewed: 06/06/2017 Elsevier Patient Education  2020 [employer]  

## 2019-11-27 NOTE — Progress Notes (Signed)
Patient comes in today to discuss perimenopausal symptoms. She is still having lower abdominal pain.

## 2019-11-28 ENCOUNTER — Encounter: Payer: Medicare Other | Admitting: Obstetrics and Gynecology

## 2019-11-28 ENCOUNTER — Other Ambulatory Visit
Admission: RE | Admit: 2019-11-28 | Discharge: 2019-11-28 | Disposition: A | Discharge status: Home / Self Care | Payer: Medicare Other | Source: Ambulatory Visit | Attending: Obstetrics and Gynecology | Admitting: Obstetrics and Gynecology

## 2019-11-28 DIAGNOSIS — Z01812 Encounter for preprocedural laboratory examination: Secondary | ICD-10-CM | POA: Diagnosis present

## 2019-11-28 DIAGNOSIS — Z20828 Contact with and (suspected) exposure to other viral communicable diseases: Secondary | ICD-10-CM | POA: Insufficient documentation

## 2019-11-28 LAB — CBC
HCT: 38.3 % (ref 36.0–46.0)
Hemoglobin: 13.3 g/dL (ref 12.0–15.0)
MCH: 30.9 pg (ref 26.0–34.0)
MCHC: 34.7 g/dL (ref 30.0–36.0)
MCV: 89.1 fL (ref 80.0–100.0)
Platelets: 203 10*3/uL (ref 150–400)
RBC: 4.3 MIL/uL (ref 3.87–5.11)
RDW: 12.1 % (ref 11.5–15.5)
WBC: 4.1 10*3/uL (ref 4.0–10.5)
nRBC: 0 % (ref 0.0–0.2)

## 2019-11-28 LAB — SARS CORONAVIRUS 2 (TAT 6-24 HRS): SARS Coronavirus 2: NEGATIVE

## 2019-11-29 MED ORDER — ESTROGENS CONJUGATED 0.625 MG PO TABS: 0.6250 mg | ORAL_TABLET | Freq: Every day | ORAL | 11 refills | Status: DC

## 2019-11-30 NOTE — H&P (View-Only) (Signed)
  GYNECOLOGY PREOPERATIVE HISTORY AND PHYSICAL   Subjective:  Shannon Campbell is a 43 y.o. G1P0102 here for surgical management of pelvic pain, left ovarian cyst.  Left ovarian cyst, patient notes has enlarged. Review of records note small dermoid cyst, ~ 2-3 cm. Patient reports that the cyst has gotten bigger based on a recent outside scan. No significant preoperative concerns.  Proposed surgery: Left ovarian cystectomy   Pertinent Gynecological History: Menses: s/p hysterectomy. Last mammogram: normal Date: 01/21/2019 Last pap: normal Date: 10/30/2019   Past Medical History:  Diagnosis Date  . Acute cholecystitis 12/11/2016  . Anxiety   . Asthma   . Calculus of gallbladder with acute cholecystitis without obstruction 12/11/2016  . Chicken pox 07/08/2015  . Depression   . Fibromyalgia   . GERD (gastroesophageal reflux disease)   . History of cardiac arrest   . Pap smear abnormality of cervix with LGSIL   . Pneumonia   . Substance abuse (HCC)   . VAIN (vaginal intraepithelial neoplasia)    Past Surgical History:  Procedure Laterality Date  . ABDOMINAL HYSTERECTOMY  2013  . BREAST EXCISIONAL BIOPSY Left 2005   benign  . CESAREAN SECTION  1999  . CHOLECYSTECTOMY N/A 12/12/2016   Procedure: LAPAROSCOPIC CHOLECYSTECTOMY;  Surgeon: Charles Woodham, MD;  Location: [facility];  Service: General;  Laterality: N/A;  . COLONOSCOPY WITH PROPOFOL N/A 02/24/2019   Procedure: COLONOSCOPY WITH PROPOFOL;  Surgeon: Vanga, Rohini Reddy, MD;  Location: [facility];  Service: Gastroenterology;  Laterality: N/A;  . ESOPHAGOGASTRODUODENOSCOPY (EGD) WITH PROPOFOL N/A 02/24/2019   Procedure: ESOPHAGOGASTRODUODENOSCOPY (EGD) WITH PROPOFOL;  Surgeon: Vanga, Rohini Reddy, MD;  Location: [facility];  Service: Gastroenterology;  Laterality: N/A;  . HERNIA REPAIR  2002  . NASAL SINUS SURGERY    . WRIST SURGERY     OB History  Gravida Para Term Preterm AB Living  1 1   1   2  SAB TAB Ectopic  Multiple Live Births        1 2    # Outcome Date GA Lbr Len/2nd Weight Sex Delivery Anes PTL Lv  1A Preterm 1999 [redacted]w[redacted]d  3 lb (1.361 kg) M Vag-Spont   LIV  1B Preterm 1999 [redacted]w[redacted]d  2 lb (0.907 kg) M Vag-Spont   LIV  Patient denies any other pertinent gynecologic issues.  Family History  Problem Relation Age of Onset  . Thyroid disease Mother   . Hypertension Father   . Hemochromatosis Father   . Breast cancer Maternal Grandmother    Social History   Socioeconomic History  . Marital status: Married    Spouse name: Not on file  . Number of children: 2  . Years of education: 12  . Highest education level: Not on file  Occupational History  . Occupation: Disability  Social Needs  . Financial resource strain: Not on file  . Food insecurity    Worry: Not on file    Inability: Not on file  . Transportation needs    Medical: Not on file    Non-medical: Not on file  Tobacco Use  . Smoking status: Current Every Day Smoker    Packs/day: 1.00    Years: 15.00    Pack years: 15.00    Types: Cigarettes    Start date: 10/11/2014  . Smokeless tobacco: Never Used  . Tobacco comment: nicotine  Substance and Sexual Activity  . Alcohol use: No    Comment: in recovery for 3.5 years; last used ETOH 1 month ago  .   Drug use: Not Currently    Types: "Crack" cocaine, Heroin, Cocaine    Comment: in recovery for 3.5 years; Last used drugs 1 month ago  . Sexual activity: Yes    Birth control/protection: Other-see comments    Comment: hysterectomy  Lifestyle  . Physical activity    Days per week: Not on file    Minutes per session: Not on file  . Stress: Not on file  Relationships  . Social Musician on phone: Not on file    Gets together: Not on file    Attends religious service: Not on file    Active member of club or organization: Not on file    Attends meetings of clubs or organizations: Not on file    Relationship status: Not on file  . Intimate partner violence    Fear  of current or ex partner: Not on file    Emotionally abused: Not on file    Physically abused: Not on file    Forced sexual activity: Not on file  Other Topics Concern  . Not on file  Social History Narrative   Right handed    Lives with husband   Caffeine use: 2 cups per day (coffee/tea)   Current Outpatient Medications on File Prior to Visit  Medication Sig Dispense Refill  . albuterol (PROVENTIL HFA;VENTOLIN HFA) 108 (90 Base) MCG/ACT inhaler Inhale 1-2 puffs into the lungs every 6 (six) hours as needed for wheezing or shortness of breath. 1 Inhaler 0  . buPROPion (WELLBUTRIN XL) 300 MG 24 hr tablet Take 300 mg by mouth every morning.     . clonazePAM (KLONOPIN) 0.5 MG tablet Take 0.5 mg by mouth daily as needed for anxiety.     Marland Kitchen EPINEPHrine 0.3 mg/0.3 mL IJ SOAJ injection Inject 0.3 mg into the muscle as needed for anaphylaxis.     Marland Kitchen traZODone (DESYREL) 50 MG tablet Take 100 mg by mouth at bedtime as needed for sleep.     . [DISCONTINUED] levocetirizine (XYZAL) 5 MG tablet      No current facility-administered medications on file prior to visit.    Allergies  Allergen Reactions  . Prednisone Anaphylaxis, Hives, Swelling and Other (See Comments)    Throat swells      Review of Systems Constitutional: No recent fever/chills/sweats Respiratory: No recent cough/bronchitis Cardiovascular: No chest pain Gastrointestinal: No recent nausea/vomiting/diarrhea Genitourinary: No UTI symptoms Hematologic/lymphatic:No history of coagulopathy or recent blood thinner use    Objective:   Blood pressure 115/82, pulse (!) 103, height 5\' 3"  (1.6 m), weight 159 lb 3.2 oz (72.2 kg), last menstrual period 12/26/2011. CONSTITUTIONAL: Well-developed, well-nourished female in no acute distress.  HENT:  Normocephalic, atraumatic, External right and left ear normal. Oropharynx is clear and moist EYES: Conjunctivae and EOM are normal. Pupils are equal, round, and reactive to light. No scleral  icterus.  NECK: Normal range of motion, supple, no masses SKIN: Skin is warm and dry. No rash noted. Not diaphoretic. No erythema. No pallor. NEUROLOGIC: Alert and oriented to person, place, and time. Normal reflexes, muscle tone coordination. No cranial nerve deficit noted. PSYCHIATRIC: Normal mood and affect. Normal behavior. Normal judgment and thought content. CARDIOVASCULAR: Normal heart rate noted, regular rhythm RESPIRATORY: Effort and breath sounds normal, no problems with respiration noted ABDOMEN: Soft, nontender, nondistended. PELVIC: Deferred MUSCULOSKELETAL: Normal range of motion. No edema and no tenderness. 2+ distal pulses.    Labs: Results for orders placed or performed during the hospital encounter  of 11/28/19 (from the past 336 hour(s))  SARS CORONAVIRUS 2 (TAT 6-24 HRS) Nasopharyngeal Nasopharyngeal Swab   Collection Time: 11/28/19  9:43 AM   Specimen: Nasopharyngeal Swab  Result Value Ref Range   SARS Coronavirus 2 NEGATIVE NEGATIVE  CBC   Collection Time: 11/28/19  9:43 AM  Result Value Ref Range   WBC 4.1 4.0 - 10.5 K/uL   RBC 4.30 3.87 - 5.11 MIL/uL   Hemoglobin 13.3 12.0 - 15.0 g/dL   HCT 38.3 36.0 - 46.0 %   MCV 89.1 80.0 - 100.0 fL   MCH 30.9 26.0 - 34.0 pg   MCHC 34.7 30.0 - 36.0 g/dL   RDW 12.1 11.5 - 15.5 %   Platelets 203 150 - 400 K/uL   nRBC 0.0 0.0 - 0.2 %     Imaging Studies: Patient with recent imaging from outside scan.      Assessment:   1. Pelvic pain   2. Vaginal dryness   3. Left ovarian cyst   4. Perimenopausal symptoms   5. Urinary frequency   6. Incomplete bladder emptying   7. History of kidney stones     Plan:    Counseling: Procedure, risks, reasons, benefits and complications (including injury to bowel, bladder, major blood vessel, ureter, bleeding, possibility of transfusion, infection, or fistula formation) reviewed in detail. Likelihood of success in alleviating the patient's condition was discussed. Routine  postoperative instructions will be reviewed with the patient and her family in detail after surgery.  The patient concurred with the proposed plan, giving informed written consent for the surgery.   Preop testing ordered. Instructions reviewed, including NPO after midnight.  Referral made to Urology, and initiated on Flomax for urinary symptoms.  Initiated on Premarin for perimenopausal symptoms.   Rubie Maid, MD [facility]

## 2019-11-30 NOTE — H&P (Signed)
GYNECOLOGY PREOPERATIVE HISTORY AND PHYSICAL   Subjective:  Shannon Campbell is a 43 y.o. G1P0102 here for surgical management of pelvic pain, left ovarian cyst.  Left ovarian cyst, patient notes has enlarged. Review of records note small dermoid cyst, ~ 2-3 cm. Patient reports that the cyst has gotten bigger based on a recent outside scan. No significant preoperative concerns.  Proposed surgery: Left ovarian cystectomy   Pertinent Gynecological History: Menses: s/p hysterectomy. Last mammogram: normal Date: 01/21/2019 Last pap: normal Date: 10/30/2019   Past Medical History:  Diagnosis Date  . Acute cholecystitis 12/11/2016  . Anxiety   . Asthma   . Calculus of gallbladder with acute cholecystitis without obstruction 12/11/2016  . Chicken pox 07/08/2015  . Depression   . Fibromyalgia   . GERD (gastroesophageal reflux disease)   . History of cardiac arrest   . Pap smear abnormality of cervix with LGSIL   . Pneumonia   . Substance abuse (HCC)   . VAIN (vaginal intraepithelial neoplasia)    Past Surgical History:  Procedure Laterality Date  . ABDOMINAL HYSTERECTOMY  2013  . BREAST EXCISIONAL BIOPSY Left 2005   benign  . CESAREAN SECTION  1999  . CHOLECYSTECTOMY N/A 12/12/2016   Procedure: LAPAROSCOPIC CHOLECYSTECTOMY;  Surgeon: Ricarda Frameharles Woodham, MD;  Location: [facility];  Service: General;  Laterality: N/A;  . COLONOSCOPY WITH PROPOFOL N/A 02/24/2019   Procedure: COLONOSCOPY WITH PROPOFOL;  Surgeon: Toney ReilVanga, Rohini Reddy, MD;  [facility];  Service: Gastroenterology;  Laterality: N/A;  . ESOPHAGOGASTRODUODENOSCOPY (EGD) WITH PROPOFOL N/A 02/24/2019   Procedure: ESOPHAGOGASTRODUODENOSCOPY (EGD) WITH PROPOFOL;  Surgeon: Toney ReilVanga, Rohini Reddy, MD;  [facility];  Service: Gastroenterology;  Laterality: N/A;  . HERNIA REPAIR  2002  . NASAL SINUS SURGERY    . WRIST SURGERY     OB History  Gravida Para Term Preterm AB Living  1 1   1   2   SAB TAB Ectopic  Multiple Live Births        1 2    # Outcome Date GA Lbr Len/2nd Weight Sex Delivery Anes PTL Lv  1A Preterm 1999 1462w0d  3 lb (1.361 kg) M Vag-Spont   LIV  1B Preterm 1999 4262w0d  2 lb (0.907 kg) M Vag-Spont   LIV  Patient denies any other pertinent gynecologic issues.  Family History  Problem Relation Age of Onset  . Thyroid disease Mother   . Hypertension Father   . Hemochromatosis Father   . Breast cancer Maternal Grandmother    Social History   Socioeconomic History  . Marital status: Married    Spouse name: Not on file  . Number of children: 2  . Years of education: 8012  . Highest education level: Not on file  Occupational History  . Occupation: Disability  Social Needs  . Financial resource strain: Not on file  . Food insecurity    Worry: Not on file    Inability: Not on file  . Transportation needs    Medical: Not on file    Non-medical: Not on file  Tobacco Use  . Smoking status: Current Every Day Smoker    Packs/day: 1.00    Years: 15.00    Pack years: 15.00    Types: Cigarettes    Start date: 10/11/2014  . Smokeless tobacco: Never Used  . Tobacco comment: nicotine  Substance and Sexual Activity  . Alcohol use: No    Comment: in recovery for 3.5 years; last used ETOH 1 month ago  .  Drug use: Not Currently    Types: "Crack" cocaine, Heroin, Cocaine    Comment: in recovery for 3.5 years; Last used drugs 1 month ago  . Sexual activity: Yes    Birth control/protection: Other-see comments    Comment: hysterectomy  Lifestyle  . Physical activity    Days per week: Not on file    Minutes per session: Not on file  . Stress: Not on file  Relationships  . Social Musician on phone: Not on file    Gets together: Not on file    Attends religious service: Not on file    Active member of club or organization: Not on file    Attends meetings of clubs or organizations: Not on file    Relationship status: Not on file  . Intimate partner violence    Fear  of current or ex partner: Not on file    Emotionally abused: Not on file    Physically abused: Not on file    Forced sexual activity: Not on file  Other Topics Concern  . Not on file  Social History Narrative   Right handed    Lives with husband   Caffeine use: 2 cups per day (coffee/tea)   Current Outpatient Medications on File Prior to Visit  Medication Sig Dispense Refill  . albuterol (PROVENTIL HFA;VENTOLIN HFA) 108 (90 Base) MCG/ACT inhaler Inhale 1-2 puffs into the lungs every 6 (six) hours as needed for wheezing or shortness of breath. 1 Inhaler 0  . buPROPion (WELLBUTRIN XL) 300 MG 24 hr tablet Take 300 mg by mouth every morning.     . clonazePAM (KLONOPIN) 0.5 MG tablet Take 0.5 mg by mouth daily as needed for anxiety.     Marland Kitchen EPINEPHrine 0.3 mg/0.3 mL IJ SOAJ injection Inject 0.3 mg into the muscle as needed for anaphylaxis.     Marland Kitchen traZODone (DESYREL) 50 MG tablet Take 100 mg by mouth at bedtime as needed for sleep.     . [DISCONTINUED] levocetirizine (XYZAL) 5 MG tablet      No current facility-administered medications on file prior to visit.    Allergies  Allergen Reactions  . Prednisone Anaphylaxis, Hives, Swelling and Other (See Comments)    Throat swells      Review of Systems Constitutional: No recent fever/chills/sweats Respiratory: No recent cough/bronchitis Cardiovascular: No chest pain Gastrointestinal: No recent nausea/vomiting/diarrhea Genitourinary: No UTI symptoms Hematologic/lymphatic:No history of coagulopathy or recent blood thinner use    Objective:   Blood pressure 115/82, pulse (!) 103, height 5\' 3"  (1.6 m), weight 159 lb 3.2 oz (72.2 kg), last menstrual period 12/26/2011. CONSTITUTIONAL: Well-developed, well-nourished female in no acute distress.  HENT:  Normocephalic, atraumatic, External right and left ear normal. Oropharynx is clear and moist EYES: Conjunctivae and EOM are normal. Pupils are equal, round, and reactive to light. No scleral  icterus.  NECK: Normal range of motion, supple, no masses SKIN: Skin is warm and dry. No rash noted. Not diaphoretic. No erythema. No pallor. NEUROLOGIC: Alert and oriented to person, place, and time. Normal reflexes, muscle tone coordination. No cranial nerve deficit noted. PSYCHIATRIC: Normal mood and affect. Normal behavior. Normal judgment and thought content. CARDIOVASCULAR: Normal heart rate noted, regular rhythm RESPIRATORY: Effort and breath sounds normal, no problems with respiration noted ABDOMEN: Soft, nontender, nondistended. PELVIC: Deferred MUSCULOSKELETAL: Normal range of motion. No edema and no tenderness. 2+ distal pulses.    Labs: Results for orders placed or performed during the hospital encounter  of 11/28/19 (from the past 336 hour(s))  SARS CORONAVIRUS 2 (TAT 6-24 HRS) Nasopharyngeal Nasopharyngeal Swab   Collection Time: 11/28/19  9:43 AM   Specimen: Nasopharyngeal Swab  Result Value Ref Range   SARS Coronavirus 2 NEGATIVE NEGATIVE  CBC   Collection Time: 11/28/19  9:43 AM  Result Value Ref Range   WBC 4.1 4.0 - 10.5 K/uL   RBC 4.30 3.87 - 5.11 MIL/uL   Hemoglobin 13.3 12.0 - 15.0 g/dL   HCT 38.3 36.0 - 46.0 %   MCV 89.1 80.0 - 100.0 fL   MCH 30.9 26.0 - 34.0 pg   MCHC 34.7 30.0 - 36.0 g/dL   RDW 12.1 11.5 - 15.5 %   Platelets 203 150 - 400 K/uL   nRBC 0.0 0.0 - 0.2 %     Imaging Studies: Patient with recent imaging from outside scan.      Assessment:   1. Pelvic pain   2. Vaginal dryness   3. Left ovarian cyst   4. Perimenopausal symptoms   5. Urinary frequency   6. Incomplete bladder emptying   7. History of kidney stones     Plan:    Counseling: Procedure, risks, reasons, benefits and complications (including injury to bowel, bladder, major blood vessel, ureter, bleeding, possibility of transfusion, infection, or fistula formation) reviewed in detail. Likelihood of success in alleviating the patient's condition was discussed. Routine  postoperative instructions will be reviewed with the patient and her family in detail after surgery.  The patient concurred with the proposed plan, giving informed written consent for the surgery.   Preop testing ordered. Instructions reviewed, including NPO after midnight.  Referral made to Urology, and initiated on Flomax for urinary symptoms.  Initiated on Premarin for perimenopausal symptoms.   Rubie Maid, MD [facility]

## 2019-12-01 ENCOUNTER — Ambulatory Visit: Payer: Medicare Other | Admitting: Anesthesiology

## 2019-12-01 ENCOUNTER — Encounter
Admission: RE | Disposition: A | Discharge status: Home / Self Care | Payer: Self-pay | Source: Home / Self Care | Attending: Obstetrics and Gynecology

## 2019-12-01 ENCOUNTER — Other Ambulatory Visit: Payer: Self-pay

## 2019-12-01 ENCOUNTER — Ambulatory Visit
Admission: RE | Admit: 2019-12-01 | Discharge: 2019-12-01 | Disposition: A | Discharge status: Home / Self Care | Payer: Medicare Other | Attending: Obstetrics and Gynecology | Admitting: Obstetrics and Gynecology

## 2019-12-01 DIAGNOSIS — R102 Pelvic and perineal pain: Secondary | ICD-10-CM | POA: Insufficient documentation

## 2019-12-01 DIAGNOSIS — J45909 Unspecified asthma, uncomplicated: Secondary | ICD-10-CM | POA: Diagnosis not present

## 2019-12-01 DIAGNOSIS — N83202 Unspecified ovarian cyst, left side: Secondary | ICD-10-CM | POA: Diagnosis not present

## 2019-12-01 DIAGNOSIS — Z8674 Personal history of sudden cardiac arrest: Secondary | ICD-10-CM | POA: Diagnosis not present

## 2019-12-01 DIAGNOSIS — Z79899 Other long term (current) drug therapy: Secondary | ICD-10-CM | POA: Insufficient documentation

## 2019-12-01 DIAGNOSIS — Z90710 Acquired absence of both cervix and uterus: Secondary | ICD-10-CM | POA: Insufficient documentation

## 2019-12-01 DIAGNOSIS — F17210 Nicotine dependence, cigarettes, uncomplicated: Secondary | ICD-10-CM | POA: Diagnosis not present

## 2019-12-01 DIAGNOSIS — K660 Peritoneal adhesions (postprocedural) (postinfection): Secondary | ICD-10-CM | POA: Diagnosis not present

## 2019-12-01 DIAGNOSIS — M797 Fibromyalgia: Secondary | ICD-10-CM | POA: Diagnosis not present

## 2019-12-01 DIAGNOSIS — F419 Anxiety disorder, unspecified: Secondary | ICD-10-CM | POA: Insufficient documentation

## 2019-12-01 DIAGNOSIS — N941 Unspecified dyspareunia: Secondary | ICD-10-CM

## 2019-12-01 DIAGNOSIS — K219 Gastro-esophageal reflux disease without esophagitis: Secondary | ICD-10-CM | POA: Insufficient documentation

## 2019-12-01 DIAGNOSIS — N8302 Follicular cyst of left ovary: Secondary | ICD-10-CM

## 2019-12-01 DIAGNOSIS — F329 Major depressive disorder, single episode, unspecified: Secondary | ICD-10-CM | POA: Diagnosis not present

## 2019-12-01 DIAGNOSIS — N8312 Corpus luteum cyst of left ovary: Secondary | ICD-10-CM

## 2019-12-01 HISTORY — PX: LAPAROSCOPIC BILATERAL SALPINGECTOMY: SHX5889

## 2019-12-01 HISTORY — PX: OOPHORECTOMY: SHX6387

## 2019-12-01 SURGERY — OOPHORECTOMY
Anesthesia: General | Laterality: Left

## 2019-12-01 MED ORDER — SUCCINYLCHOLINE CHLORIDE 20 MG/ML IJ SOLN
INTRAMUSCULAR | Status: AC
  Filled 2019-12-01: qty 1

## 2019-12-01 MED ORDER — SUGAMMADEX SODIUM 200 MG/2ML IV SOLN
INTRAVENOUS | Status: DC | PRN
  Administered 2019-12-01: 200 mg via INTRAVENOUS

## 2019-12-01 MED ORDER — BUPIVACAINE HCL 0.5 % IJ SOLN
INTRAMUSCULAR | Status: DC | PRN
  Administered 2019-12-01: 4 mL

## 2019-12-01 MED ORDER — BUPIVACAINE HCL (PF) 0.5 % IJ SOLN
INTRAMUSCULAR | Status: AC
  Filled 2019-12-01: qty 30

## 2019-12-01 MED ORDER — ONDANSETRON HCL 4 MG/2ML IJ SOLN
INTRAMUSCULAR | Status: AC
  Administered 2019-12-01: 4 mg via INTRAVENOUS
  Filled 2019-12-01: qty 2

## 2019-12-01 MED ORDER — ONDANSETRON HCL 4 MG/2ML IJ SOLN
INTRAMUSCULAR | Status: DC | PRN
  Administered 2019-12-01: 4 mg via INTRAVENOUS

## 2019-12-01 MED ORDER — MIDAZOLAM HCL 2 MG/2ML IJ SOLN
INTRAMUSCULAR | Status: AC
  Filled 2019-12-01: qty 2

## 2019-12-01 MED ORDER — GABAPENTIN 300 MG PO CAPS
ORAL_CAPSULE | ORAL | Status: AC
  Administered 2019-12-01: 300 mg via ORAL
  Filled 2019-12-01: qty 1

## 2019-12-01 MED ORDER — ESTROGENS CONJUGATED 0.625 MG PO TABS: 0.6250 mg | ORAL_TABLET | Freq: Every day | ORAL | 11 refills | Status: DC

## 2019-12-01 MED ORDER — ACETAMINOPHEN 500 MG PO TABS
ORAL_TABLET | ORAL | Status: AC
  Administered 2019-12-01: 1000 mg via ORAL
  Filled 2019-12-01: qty 2

## 2019-12-01 MED ORDER — LACTATED RINGERS IV SOLN
INTRAVENOUS | Status: DC | PRN
  Administered 2019-12-01: 08:00:00 via INTRAVENOUS

## 2019-12-01 MED ORDER — OXYCODONE HCL 5 MG PO TABS
ORAL_TABLET | ORAL | Status: AC
  Administered 2019-12-01: 5 mg via ORAL
  Filled 2019-12-01: qty 1

## 2019-12-01 MED ORDER — FAMOTIDINE 20 MG PO TABS
20.0000 mg | ORAL_TABLET | Freq: Once | ORAL | Status: AC
  Administered 2019-12-01: 07:00:00 20 mg via ORAL

## 2019-12-01 MED ORDER — OXYCODONE HCL 5 MG PO TABS
5.0000 mg | ORAL_TABLET | Freq: Once | ORAL | Status: AC | PRN
  Administered 2019-12-01: 10:00:00 5 mg via ORAL

## 2019-12-01 MED ORDER — FAMOTIDINE 20 MG PO TABS
ORAL_TABLET | ORAL | Status: AC
  Administered 2019-12-01: 20 mg via ORAL
  Filled 2019-12-01: qty 1

## 2019-12-01 MED ORDER — FENTANYL CITRATE (PF) 100 MCG/2ML IJ SOLN
INTRAMUSCULAR | Status: AC
  Administered 2019-12-01: 50 ug via INTRAVENOUS
  Filled 2019-12-01: qty 2

## 2019-12-01 MED ORDER — PROPOFOL 10 MG/ML IV BOLUS
INTRAVENOUS | Status: DC | PRN
  Administered 2019-12-01: 150 mg via INTRAVENOUS

## 2019-12-01 MED ORDER — IBUPROFEN 800 MG PO TABS: 800.0000 mg | ORAL_TABLET | Freq: Three times a day (TID) | ORAL | 1 refills | Status: DC | PRN

## 2019-12-01 MED ORDER — LIDOCAINE HCL (PF) 2 % IJ SOLN
INTRAMUSCULAR | Status: AC
  Filled 2019-12-01: qty 10

## 2019-12-01 MED ORDER — ROCURONIUM BROMIDE 100 MG/10ML IV SOLN
INTRAVENOUS | Status: DC | PRN
  Administered 2019-12-01: 20 mg via INTRAVENOUS
  Administered 2019-12-01: 50 mg via INTRAVENOUS

## 2019-12-01 MED ORDER — LIDOCAINE HCL (CARDIAC) PF 100 MG/5ML IV SOSY
PREFILLED_SYRINGE | INTRAVENOUS | Status: DC | PRN
  Administered 2019-12-01: 100 mg via INTRAVENOUS

## 2019-12-01 MED ORDER — ACETAMINOPHEN ER 650 MG PO TBCR: 650.0000 mg | EXTENDED_RELEASE_TABLET | Freq: Three times a day (TID) | ORAL | 1 refills | Status: AC | PRN

## 2019-12-01 MED ORDER — LACTATED RINGERS IV SOLN: INTRAVENOUS | Status: DC

## 2019-12-01 MED ORDER — HYDROMORPHONE HCL 1 MG/ML IJ SOLN
0.5000 mg | INTRAMUSCULAR | Status: DC | PRN
  Administered 2019-12-01: 10:00:00 0.5 mg via INTRAVENOUS

## 2019-12-01 MED ORDER — FENTANYL CITRATE (PF) 100 MCG/2ML IJ SOLN
25.0000 ug | INTRAMUSCULAR | Status: AC | PRN
  Administered 2019-12-01 (×2): 25 ug via INTRAVENOUS
  Administered 2019-12-01: 09:00:00 50 ug via INTRAVENOUS
  Administered 2019-12-01: 25 ug via INTRAVENOUS
  Administered 2019-12-01: 09:00:00 50 ug via INTRAVENOUS
  Administered 2019-12-01: 25 ug via INTRAVENOUS

## 2019-12-01 MED ORDER — GABAPENTIN 300 MG PO CAPS
300.0000 mg | ORAL_CAPSULE | ORAL | Status: AC
  Administered 2019-12-01: 07:00:00 300 mg via ORAL

## 2019-12-01 MED ORDER — ROCURONIUM BROMIDE 50 MG/5ML IV SOLN
INTRAVENOUS | Status: AC
  Filled 2019-12-01: qty 1

## 2019-12-01 MED ORDER — ONDANSETRON HCL 4 MG/2ML IJ SOLN
4.0000 mg | Freq: Once | INTRAMUSCULAR | Status: AC
  Administered 2019-12-01: 09:00:00 4 mg via INTRAVENOUS

## 2019-12-01 MED ORDER — FENTANYL CITRATE (PF) 100 MCG/2ML IJ SOLN
INTRAMUSCULAR | Status: AC
  Filled 2019-12-01: qty 2

## 2019-12-01 MED ORDER — HYDROCODONE-ACETAMINOPHEN 5-325 MG PO TABS: 1.0000 | ORAL_TABLET | Freq: Four times a day (QID) | ORAL | 0 refills | Status: DC | PRN

## 2019-12-01 MED ORDER — MIDAZOLAM HCL 2 MG/2ML IJ SOLN
INTRAMUSCULAR | Status: DC | PRN
  Administered 2019-12-01: 2 mg via INTRAVENOUS

## 2019-12-01 MED ORDER — HYDROMORPHONE HCL 1 MG/ML IJ SOLN
INTRAMUSCULAR | Status: AC
  Administered 2019-12-01: 0.5 mg via INTRAVENOUS
  Filled 2019-12-01: qty 1

## 2019-12-01 MED ORDER — LIDOCAINE 5 % EX PTCH
1.0000 | MEDICATED_PATCH | CUTANEOUS | Status: DC
  Administered 2019-12-01: 1 via TRANSDERMAL
  Filled 2019-12-01: qty 1

## 2019-12-01 MED ORDER — PROPOFOL 10 MG/ML IV BOLUS
INTRAVENOUS | Status: AC
  Filled 2019-12-01: qty 20

## 2019-12-01 MED ORDER — LACTATED RINGERS IV SOLN
INTRAVENOUS | Status: DC
  Administered 2019-12-01: 07:00:00 via INTRAVENOUS

## 2019-12-01 MED ORDER — ACETAMINOPHEN 500 MG PO TABS
1000.0000 mg | ORAL_TABLET | ORAL | Status: AC
  Administered 2019-12-01: 07:00:00 1000 mg via ORAL

## 2019-12-01 MED ORDER — FENTANYL CITRATE (PF) 100 MCG/2ML IJ SOLN
INTRAMUSCULAR | Status: DC | PRN
  Administered 2019-12-01 (×2): 50 ug via INTRAVENOUS

## 2019-12-01 MED ORDER — DEXMEDETOMIDINE HCL 200 MCG/2ML IV SOLN
INTRAVENOUS | Status: DC | PRN
  Administered 2019-12-01: 8 ug via INTRAVENOUS
  Administered 2019-12-01: 12 ug via INTRAVENOUS

## 2019-12-01 MED ORDER — OXYCODONE HCL 5 MG/5ML PO SOLN: 5.0000 mg | Freq: Once | ORAL | Status: AC | PRN

## 2019-12-01 SURGICAL SUPPLY — 40 items
BLADE SURG SZ11 CARB STEEL (BLADE) ×4 IMPLANT
CANISTER SUCT 1200ML W/VALVE (MISCELLANEOUS) ×4 IMPLANT
CATH ROBINSON RED A/P 16FR (CATHETERS) ×4 IMPLANT
CHLORAPREP W/TINT 26 (MISCELLANEOUS) ×4 IMPLANT
CORD MONOPOLAR M/FML 12FT (MISCELLANEOUS) IMPLANT
COVER WAND RF STERILE (DRAPES) IMPLANT
DERMABOND ADVANCED (GAUZE/BANDAGES/DRESSINGS) ×2
DERMABOND ADVANCED .7 DNX12 (GAUZE/BANDAGES/DRESSINGS) ×2 IMPLANT
GLOVE BIO SURGEON STRL SZ 6.5 (GLOVE) ×3 IMPLANT
GLOVE BIO SURGEON STRL SZ8 (GLOVE) ×4 IMPLANT
GLOVE BIO SURGEONS STRL SZ 6.5 (GLOVE) ×1
GLOVE INDICATOR 7.0 STRL GRN (GLOVE) ×4 IMPLANT
GOWN STRL REUS W/ TWL LRG LVL3 (GOWN DISPOSABLE) ×4 IMPLANT
GOWN STRL REUS W/TWL LRG LVL3 (GOWN DISPOSABLE) ×4
GOWN STRL REUS W/TWL XL LVL4 (GOWN DISPOSABLE) ×4 IMPLANT
GRASPER SUT TROCAR 14GX15 (MISCELLANEOUS) IMPLANT
IRRIGATION STRYKERFLOW (MISCELLANEOUS) ×2 IMPLANT
IRRIGATOR STRYKERFLOW (MISCELLANEOUS) ×4
IV LACTATED RINGERS 1000ML (IV SOLUTION) ×4 IMPLANT
KIT PINK PAD W/HEAD ARE REST (MISCELLANEOUS) ×4
KIT PINK PAD W/HEAD ARM REST (MISCELLANEOUS) ×2 IMPLANT
KIT TURNOVER CYSTO (KITS) ×4 IMPLANT
LIGASURE LAP MARYLAND 5MM 37CM (ELECTROSURGICAL) ×4 IMPLANT
NS IRRIG 500ML POUR BTL (IV SOLUTION) ×4 IMPLANT
PACK GYN LAPAROSCOPIC (MISCELLANEOUS) ×4 IMPLANT
PAD OB MATERNITY 4.3X12.25 (PERSONAL CARE ITEMS) ×4 IMPLANT
PAD PREP 24X41 OB/GYN DISP (PERSONAL CARE ITEMS) ×4 IMPLANT
POUCH ENDO CATCH 10MM SPEC (MISCELLANEOUS) ×4 IMPLANT
POUCH SPECIMEN RETRIEVAL 10MM (ENDOMECHANICALS) IMPLANT
SCISSORS METZENBAUM CVD 33 (INSTRUMENTS) IMPLANT
SET TUBE SMOKE EVAC HIGH FLOW (TUBING) ×4 IMPLANT
SHEARS HARMONIC ACE PLUS 36CM (ENDOMECHANICALS) IMPLANT
SLEEVE ENDOPATH XCEL 5M (ENDOMECHANICALS) ×4 IMPLANT
SUT VIC AB 3-0 SH 27 (SUTURE)
SUT VIC AB 3-0 SH 27X BRD (SUTURE) IMPLANT
SUT VICRYL 0 AB UR-6 (SUTURE) ×4 IMPLANT
SYR 30ML LL (SYRINGE) ×4 IMPLANT
TROCAR ENDO BLADELESS 11MM (ENDOMECHANICALS) ×4 IMPLANT
TROCAR XCEL NON-BLD 5MMX100MML (ENDOMECHANICALS) ×4 IMPLANT
TROCAR XCEL UNIV SLVE 11M 100M (ENDOMECHANICALS) ×4 IMPLANT

## 2019-12-01 NOTE — Anesthesia Post-op Follow-up Note (Signed)
Anesthesia QCDR form completed.        

## 2019-12-01 NOTE — Op Note (Signed)
Procedure(s): OOPHORECTOMY LAPAROSCOPIC BILATERAL SALPINGECTOMY Procedure Note  Shannon Campbell female 43 y.o. 12/01/2019  Indications: The patient is a 43 y.o. G6P0102 female with left ovarian cyst, pelvic pain, dyspareunia. Prior history of hysterectomy and tubal ligation  Pre-operative Diagnosis: Left ovarian cyst, pelvic pain, dyspareunia. Previous hysterectomy and tubal ligation.   Post-operative Diagnosis: Same with with pelvic adhesions  Surgeon: Rubie Maid, MD  Assistants:  Jeannie Fend, MD.  An experienced assistant was required given the standard of surgical care and the complexity of the case.  This assistant was needed for exposure, dissection, suctioning, retraction, instrument exchange, and for overall help during the procedure.  Anesthesia: General endotracheal anesthesia  ASA Class:   Procedure Details: The patient was seen in the Holding Room. The risks, benefits, complications, treatment options, and expected outcomes were discussed with the patient.  The patient concurred with the proposed plan, giving informed consent.  The site of surgery properly noted/marked. The patient was taken to the Operating Room, identified as Shannon Campbell and the procedure verified as Procedure(s) (LRB): OOPHORECTOMY (Left) LAPAROSCOPIC BILATERAL SALPINGECTOMY (Bilateral). A Time Out was held and the above information confirmed.   She was then placed under general anesthesia without difficulty. She was placed in the dorsal lithotomy position, and was prepped and draped in a sterile manner.  A straight catheterization was performed. A sponge-stick was placed in the vagina for manipulation.  After an adequate timeout was performed, attention was turned to the abdomen where an umbilical incision was made with the scalpel.  The Optiview 5-mm trocar and sleeve were then advanced without difficulty with the laparoscope under direct visualization into the abdomen.  The abdomen was then  insufflated with carbon dioxide gas and adequate pneumoperitoneum was obtained. A 5-mm right lower quadrant port and an 11-mm left lower quadrant port were then placed under direct visualization.  A survey of the patient's pelvis and abdomen revealed the findings as above.  On the left side, the infundibulopelvic ligament was clamped and transected with the Ligasure device. There were adhesions of the small bowel to the vaginal cuff present. These were lysed using the Ligasure device.  An Endocatch bag was then inserted into the 11-mm trochar and the left ovary and bilateral fallopian tubes were removed.  The fascia of 11-mm trochar site was closed using the [employer] device.    A final survey was performed, where good hemostasis was noted throughout.  All remaining trocars were removed under direct visualization, and the abdomen which was desufflated.    All skin incisions were closed with 4-0 Vicryl subcuticular stitches. The patient tolerated the procedures well.  Incisions were injected with 0.5% Marcaine. All instruments, needles, and sponge counts were correct x 2. The patient was taken to the recovery room awake, extubated and in stable condition.   Findings: The uterus was surgically absent.  Fallopian tubes with Filshie clips present bilaterally. Left ovary with multi-lobulated cyst.  Right ovary appeared normal.  Normal upper abdomen.   Estimated Blood Loss:  minimal      Drains: straight catheterization prior to procedure with 30 ml of clear urine         Total IV Fluids:  800 ml  Specimens: Left ovary, bilateral fallopian tubes         Implants: None         Complications:  None; patient tolerated the procedure well.         Disposition: PACU - hemodynamically stable.  Condition: stable   Rubie Maid, MD [facility]

## 2019-12-01 NOTE — Anesthesia Preprocedure Evaluation (Signed)
Anesthesia Evaluation  Patient identified by MRN, date of birth, ID band Patient awake    Reviewed: Allergy & Precautions, H&P , NPO status , Patient's Chart, lab work & pertinent test results  History of Anesthesia Complications Negative for: history of anesthetic complications  Airway Mallampati: III  TM Distance: >3 FB Neck ROM: full    Dental  (+) Chipped, Poor Dentition   Pulmonary neg shortness of breath, asthma , pneumonia, Current Smoker and Patient abstained from smoking.,           Cardiovascular Exercise Tolerance: Good (-) Past MI negative cardio ROS       Neuro/Psych PSYCHIATRIC DISORDERS  Neuromuscular disease    GI/Hepatic Neg liver ROS, GERD  Medicated and Controlled,  Endo/Other  negative endocrine ROS  Renal/GU      Musculoskeletal   Abdominal   Peds  Hematology negative hematology ROS (+)   Anesthesia Other Findings Past Medical History: 12/11/2016: Acute cholecystitis No date: Anxiety No date: Asthma 12/11/2016: Calculus of gallbladder with acute cholecystitis without  obstruction 07/08/2015: Chicken pox No date: Depression No date: Fibromyalgia No date: GERD (gastroesophageal reflux disease) No date: History of cardiac arrest No date: Pap smear abnormality of cervix with LGSIL No date: Pneumonia No date: Substance abuse ([place]) No date: VAIN (vaginal intraepithelial neoplasia)  Past Surgical History: 2013: ABDOMINAL HYSTERECTOMY 2005: BREAST EXCISIONAL BIOPSY; Left     Comment:  benign 1999: CESAREAN SECTION 12/12/2016: CHOLECYSTECTOMY; N/A     Comment:  Procedure: LAPAROSCOPIC CHOLECYSTECTOMY;  Surgeon:               Clayburn Pert, MD;  Location: [facility] ORS;  Service:               General;  Laterality: N/A; 02/24/2019: COLONOSCOPY WITH PROPOFOL; N/A     Comment:  Procedure: COLONOSCOPY WITH PROPOFOL;  Surgeon: Lin Landsman, MD;  Location: [facility];   Service:               Gastroenterology;  Laterality: N/A; 02/24/2019: ESOPHAGOGASTRODUODENOSCOPY (EGD) WITH PROPOFOL; N/A     Comment:  Procedure: ESOPHAGOGASTRODUODENOSCOPY (EGD) WITH               PROPOFOL;  Surgeon: Lin Landsman, MD;  Location:               [facility];  Service: Gastroenterology;  Laterality:               N/A; 2002: HERNIA REPAIR No date: NASAL SINUS SURGERY No date: WRIST SURGERY  BMI    Body Mass Index: 28.20 kg/m      Reproductive/Obstetrics negative OB ROS                             Anesthesia Physical Anesthesia Plan  ASA: III  Anesthesia Plan: General ETT   Post-op Pain Management:    Induction: Intravenous  PONV Risk Score and Plan: Ondansetron, Dexamethasone, Midazolam and Treatment may vary due to age or medical condition  Airway Management Planned: Oral ETT  Additional Equipment:   Intra-op Plan:   Post-operative Plan: Extubation in OR  Informed Consent: I have reviewed the patients History and Physical, chart, labs and discussed the procedure including the risks, benefits and alternatives for the proposed anesthesia with the patient or authorized representative who has indicated his/her understanding and acceptance.  Dental Advisory Given  Plan Discussed with: Anesthesiologist, CRNA and Surgeon  Anesthesia Plan Comments: (Patient consented for risks of anesthesia including but not limited to:  - adverse reactions to medications - damage to teeth, lips or other oral mucosa - sore throat or hoarseness - Damage to heart, brain, lungs or loss of life  Patient voiced understanding.)        Anesthesia Quick Evaluation

## 2019-12-01 NOTE — Interval H&P Note (Signed)
History and Physical Interval Note:  12/01/2019 7:17 AM  Shannon Campbell  has presented today for surgery, with the diagnosis of Left ovarian cyst Pelvic pain.  The various methods of treatment have been discussed with the patient and family. After consideration of risks, benefits and other options for treatment, the patient has consented to  Procedure(s): LAPAROSCOPIC OVARIAN CYSTECTOMY (Left) OOPHORECTOMY (Left) as a surgical intervention.  The patient's history has been reviewed, patient examined, no change in status, stable for surgery.  I have reviewed the patient's chart and labs.  Questions were answered to the patient's satisfaction.     Rubie Maid, MD [facility]

## 2019-12-01 NOTE — Anesthesia Procedure Notes (Signed)
Procedure Name: Intubation Date/Time: 12/01/2019 7:43 AM Performed by: Justus Memory, CRNA Pre-anesthesia Checklist: Patient identified, Patient being monitored, Timeout performed, Emergency Drugs available and Suction available Patient Re-evaluated:Patient Re-evaluated prior to induction Oxygen Delivery Method: Circle system utilized Preoxygenation: Pre-oxygenation with 100% oxygen Induction Type: IV induction Ventilation: Mask ventilation without difficulty Laryngoscope Size: 3 and Glidescope Grade View: Grade I Tube type: Oral Tube size: 7.0 mm Number of attempts: 1 Airway Equipment and Method: Stylet,  Video-laryngoscopy and Patient positioned with wedge pillow Placement Confirmation: ETT inserted through vocal cords under direct vision,  positive ETCO2 and breath sounds checked- equal and bilateral Secured at: 21 cm Tube secured with: Tape Dental Injury: Teeth and Oropharynx as per pre-operative assessment

## 2019-12-01 NOTE — Anesthesia Postprocedure Evaluation (Signed)
Anesthesia Post Note  Patient: Shannon Campbell  Procedure(s) Performed: OOPHORECTOMY (Left ) LAPAROSCOPIC BILATERAL SALPINGECTOMY (Bilateral )  Patient location during evaluation: PACU Anesthesia Type: General Level of consciousness: awake and alert Pain management: pain level controlled Vital Signs Assessment: post-procedure vital signs reviewed and stable Respiratory status: spontaneous breathing, nonlabored ventilation, respiratory function stable and patient connected to nasal cannula oxygen Cardiovascular status: blood pressure returned to baseline and stable Postop Assessment: no apparent nausea or vomiting Anesthetic complications: no     Last Vitals:  Vitals:   12/01/19 1034 12/01/19 1049  BP: 121/68 110/66  Pulse: 77 82  Resp: 18 16  Temp: 36.9 C   SpO2: 100% 100%    Last Pain:  Vitals:   12/01/19 1049  TempSrc:   PainSc: 4                  Joseph K Piscitello

## 2019-12-01 NOTE — Transfer of Care (Signed)
Immediate Anesthesia Transfer of Care Note  Patient: Shannon Campbell  Procedure(s) Performed: OOPHORECTOMY (Left ) LAPAROSCOPIC BILATERAL SALPINGECTOMY (Bilateral )  Patient Location: PACU  Anesthesia Type:General  Level of Consciousness: awake  Airway & Oxygen Therapy: Patient connected to face mask oxygen  Post-op Assessment: Post -op Vital signs reviewed and stable  Post vital signs: Reviewed  Last Vitals:  Vitals Value Taken Time  BP 135/81 12/01/19 0903  Temp    Pulse 99 12/01/19 0903  Resp 25 12/01/19 0903  SpO2 100 % 12/01/19 0903    Last Pain:  Vitals:   12/01/19 0631  TempSrc: Tympanic  PainSc: 5          Complications: No apparent anesthesia complications

## 2019-12-01 NOTE — Discharge Instructions (Addendum)
Bilateral Salpingo-Oophorectomy, Care After This sheet gives you information about how to care for yourself after your procedure. Your health care provider may also give you more specific instructions. If you have problems or questions, contact your health care provider. What can I expect after the procedure? After the procedure, it is common to have:  Abdominal pain.  Some occasional vaginal bleeding (spotting).  Tiredness.  Symptoms of menopause, such as hot flashes, night sweats, or mood swings. Follow these instructions at home: Incision care   Keep your incision area and your bandage (dressing) clean and dry.  Follow instructions from your health care provider about how to take care of your incision. Make sure you: ? Wash your hands with soap and water before you change your dressing. If soap and water are not available, use hand sanitizer. ? Change your dressing as told by your health care provider. ? Leave stitches (sutures), staples, skin glue, or adhesive strips in place. These skin closures may need to stay in place for 2 weeks or longer. If adhesive strip edges start to loosen and curl up, you may trim the loose edges. Do not remove adhesive strips completely unless your health care provider tells you to do that.  Check your incision area every day for signs of infection. Check for: ? Redness, swelling, or pain. ? Fluid or blood. ? Warmth. ? Pus or a bad smell. Activity   Do not drive or use heavy machinery while taking prescription pain medicine.  Do not drive for 24 hours if you received a medicine to help you relax (sedative) during your procedure.  Take frequent, short walks throughout the day. Rest when you get tired. Ask your health care provider what activities are safe for you.  Avoid activity that requires great effort. Also, avoid heavy lifting. Do not lift anything that is heavier than 10 lbs. (4.5 kg), or the limit that your health care provider tells you,  until he or she says that it is safe to do so.  Do not douche, use tampons, or have sex until your health care provider approves. General instructions   To prevent or treat constipation while you are taking prescription pain medicine, your health care provider may recommend that you: ? Drink enough fluid to keep your urine clear or pale yellow. ? Take over-the-counter or prescription medicines. ? Eat foods that are high in fiber, such as fresh fruits and vegetables, whole grains, and beans. ? Limit foods that are high in fat and processed sugars, such as fried and sweet foods.  Take over-the-counter and prescription medicines only as told by your health care provider.  Do not take baths, swim, or use a hot tub until your health care provider approves. Ask your health care provider if you can take showers. You may only be allowed to take sponge baths for bathing.  Wear compression stockings as told by your health care provider. These stockings help to prevent blood clots and reduce swelling in your legs.  Keep all follow-up visits as told by your health care provider. This is important. Contact a health care provider if:  You have pain when you urinate.  You have pus or a bad smelling discharge coming from your vagina.  You have redness, swelling, or pain around your incision.  You have fluid or blood coming from your incision.  Your incision feels warm to the touch.  You have pus or a bad smell coming from your incision.  You have a fever.  Your incision starts to break open.  You have pain in the abdomen, and it gets worse or does not get better when you take medicine.  You develop a rash.  You develop nausea and vomiting.  You feel lightheaded. Get help right away if:  You develop pain in your chest or leg.  You become short of breath.  You faint.  You have increased bleeding from your vagina. Summary  After the procedure, it is common to have pain, bleeding  in the vagina, and symptoms of menopause.  Follow instructions from your health care provider about how to take care of your incision.  Follow instructions from your health care provider about activities and restrictions.  Check your incision every day for signs of infection and report any symptoms to your health care provider. This information is not intended to replace advice given to you by your health care provider. Make sure you discuss any questions you have with your health care provider. Document Released: 12/11/2005 Document Revised: 02/14/2019 Document Reviewed: 01/15/2017 Elsevier Patient Education  2020 [place]   1) The drugs that you were given will stay in your system until tomorrow so for the next 24 hours you should not:  A) Drive an automobile B) Make any legal decisions C) Drink any alcoholic beverage   2) You may resume regular meals tomorrow.  Today it is better to start with liquids and gradually work up to solid foods.  You may eat anything you prefer, but it is better to start with liquids, then soup and crackers, and gradually work up to solid foods.   3) Please notify your doctor immediately if you have any unusual bleeding, trouble breathing, redness and pain at the surgery site, drainage, fever, or pain not relieved by medication.    4) Additional Instructions:        Please contact your physician with any problems or Same Day Surgery at [phone], Monday through Friday 6 am to 4 pm, or [facility] number at [phone].

## 2019-12-02 ENCOUNTER — Encounter: Payer: Self-pay | Admitting: Obstetrics and Gynecology

## 2019-12-02 ENCOUNTER — Telehealth: Payer: Self-pay

## 2019-12-02 LAB — SURGICAL PATHOLOGY

## 2019-12-02 NOTE — Telephone Encounter (Signed)
Pt spouse Rich called to report pt in extreme pain. Left sided swelling visibly a lot. Pt cannot ambulate without excruciating pain. Please call.

## 2019-12-02 NOTE — Telephone Encounter (Signed)
Please see if patient has tried the lidocaine patch for pain, or used ice packs to the area.  Also, if she needs to be seen we can try to work her in tomorrow.

## 2019-12-03 MED ORDER — SIMETHICONE 125 MG PO CAPS: ORAL_CAPSULE | ORAL | 0 refills | Status: DC

## 2019-12-03 MED ORDER — DOCUSATE SODIUM 100 MG PO CAPS: 100.0000 mg | ORAL_CAPSULE | Freq: Two times a day (BID) | ORAL | 0 refills | Status: AC

## 2019-12-03 NOTE — Telephone Encounter (Signed)
Called pt to inform her that colace and gas x had been sent in to her pharmacy and the medication can be pick up OTC as well.

## 2019-12-03 NOTE — Telephone Encounter (Signed)
Spoke with pt's husband concerning her pain. Was informed that pt has a pain level of 8 on a scale of 0-10.  Pt using ice pack and lidocaine packs as prescribed. Pt taking 2 tabs of Vicodin as prescribed. Pt scheduled an appointment with [facility] tomorrow. Pt stated she was unable to come in this evening.

## 2019-12-03 NOTE — Addendum Note (Signed)
Addended by: Edwyna Shell on: 12/03/2019 11:53 AM   Modules accepted: Orders

## 2019-12-03 NOTE — Telephone Encounter (Signed)
Pt's husband called back and stated that he has a meeting at work tomorrow at 12:30pm and would be unable to bring her to the appointment tomorrow and had to reschedule. Appointment canceled. Pt requesting medication for constipation and gas.

## 2019-12-04 ENCOUNTER — Encounter: Payer: Self-pay | Admitting: Obstetrics and Gynecology

## 2019-12-08 ENCOUNTER — Other Ambulatory Visit: Payer: Self-pay | Admitting: Obstetrics and Gynecology

## 2019-12-08 ENCOUNTER — Other Ambulatory Visit: Payer: Self-pay | Admitting: Nurse Practitioner

## 2019-12-08 DIAGNOSIS — Z1231 Encounter for screening mammogram for malignant neoplasm of breast: Secondary | ICD-10-CM

## 2019-12-10 ENCOUNTER — Ambulatory Visit (INDEPENDENT_AMBULATORY_CARE_PROVIDER_SITE_OTHER): Payer: Medicare Other | Admitting: Obstetrics and Gynecology

## 2019-12-10 ENCOUNTER — Other Ambulatory Visit: Payer: Self-pay

## 2019-12-10 ENCOUNTER — Encounter: Payer: Self-pay | Admitting: Obstetrics and Gynecology

## 2019-12-10 VITALS — BP 107/71 | HR 85 | Ht 63.0 in | Wt 159.4 lb

## 2019-12-10 DIAGNOSIS — Z90721 Acquired absence of ovaries, unilateral: Secondary | ICD-10-CM

## 2019-12-10 DIAGNOSIS — Z09 Encounter for follow-up examination after completed treatment for conditions other than malignant neoplasm: Secondary | ICD-10-CM

## 2019-12-10 DIAGNOSIS — N951 Menopausal and female climacteric states: Secondary | ICD-10-CM

## 2019-12-10 NOTE — Progress Notes (Signed)
    OBSTETRICS/GYNECOLOGY POST-OPERATIVE CLINIC VISIT  Subjective:     Shannon Campbell is a 43 y.o. female who presents to the clinic 1 week status post laparoscopic bilateral salpingectomy and left oophorectomy for left adnexal mass and pelvic pain. Eating a regular diet without difficulty. Bowel movements are normal now, however had significant constipation last week requiring use of multiple laxatives/stool softeners. The patient is not having any pain.  Patient reports some confusion of her hormone medications as she has 2 at the pharmacy. Advised her to complete the one she is using currently, but to start on the Premarin tablets at the end of the month. Notes that overall she has begun to notice some improvement in her sex drive and is feeling optimistic.  The following portions of the patient's history were reviewed and updated as appropriate: allergies, current medications, past family history, past medical history, past social history, past surgical history and problem list.  Review of Systems Pertinent items noted in HPI and remainder of comprehensive ROS otherwise negative.   Objective:    BP 107/71   Pulse 85   Ht 5\' 3"  (1.6 m)   Wt 159 lb 6.4 oz (72.3 kg)   LMP 12/26/2011 (LMP Unknown)   BMI 28.24 kg/m  General:  alert and no distress  Abdomen: soft, bowel sounds active, non-tender  Incision:   healing well, no drainage, no erythema, no hernia, no seroma, no swelling, no dehiscence, incision well approximated    Pathology:  A. LEFT OVARY AND BILATERAL FALLOPIAN TUBES; OOPHORECTOMY AND BILATERAL  SALPINGECTOMY:  - BENIGN OVARY WITH HEMORRHAGIC CORPUS LUTEUM AND BENIGN FOLLICLE CYSTS.  - BILATERAL BENIGN FALLOPIAN TUBES.   Assessment:    Doing well postoperatively.  S/p laparoscopic left oophorectomy with bilateral salpingectomy Perimenopausal vasomotor symptoms     1. Continue any current medications. 2. Wound care discussed. 3. Operative findings again  reviewed. Pathology report discussed. 4. Activity restrictions: none 5. Anticipated return to work: now if applicable. 6. Follow up: 6 weeks for f/u perimenopausal vasomotor symptoms currently on HRT.     Rubie Maid, MD [facility]

## 2019-12-10 NOTE — Progress Notes (Signed)
Pt is present for post op visit. Pt stated that she was doing well.

## 2020-01-07 ENCOUNTER — Encounter: Payer: Self-pay | Admitting: Urology

## 2020-01-07 ENCOUNTER — Other Ambulatory Visit: Payer: Self-pay

## 2020-01-07 ENCOUNTER — Ambulatory Visit (INDEPENDENT_AMBULATORY_CARE_PROVIDER_SITE_OTHER): Payer: Medicare Other | Admitting: Urology

## 2020-01-07 VITALS — BP 101/69 | HR 109 | Ht 63.0 in | Wt 160.0 lb

## 2020-01-07 DIAGNOSIS — R310 Gross hematuria: Secondary | ICD-10-CM | POA: Diagnosis not present

## 2020-01-07 DIAGNOSIS — N200 Calculus of kidney: Secondary | ICD-10-CM | POA: Diagnosis not present

## 2020-01-07 DIAGNOSIS — R39198 Other difficulties with micturition: Secondary | ICD-10-CM | POA: Diagnosis not present

## 2020-01-07 LAB — BLADDER SCAN AMB NON-IMAGING: Scan Result: 29

## 2020-01-07 NOTE — Patient Instructions (Signed)
Pelvic Floor Dysfunction  Pelvic floor dysfunction (PFD) is a condition that results when the group of muscles and connective tissues that support the organs in the pelvis (pelvic floor muscles) do not work well. These muscles and their connections form a sling that supports the colon and bladder. In men, these muscles also support the prostate gland. In women, they also support the uterus. PFD causes pelvic floor muscles to be too weak, too tight, or a combination of both. In PFD, muscle movements are not coordinated. This condition may cause bowel or bladder problems. It may also cause pain. What are the causes? This condition may be caused by an injury to the pelvic area or by a weakening of pelvic muscles. This often results from pregnancy and childbirth or other types of strain. In many cases, the exact cause is not known. What increases the risk? The following factors may make you more likely to develop this condition:  Having a condition of chronic bladder tissue inflammation (interstitial cystitis).  Being an older person.  Being overweight.  Radiation treatment for cancer in the pelvic region.  Previous pelvic surgery, such as removal of the uterus (hysterectomy) or prostate gland (prostatectomy). What are the signs or symptoms? Symptoms of this condition vary and may include:  Bladder symptoms, such as: ? Trouble starting urination and emptying the bladder. ? Frequent urinary tract infections. ? Leaking urine when coughing, laughing, or exercising (stress incontinence). ? Having to pass urine urgently or frequently. ? Pain when passing urine.  Bowel symptoms, such as: ? Constipation. ? Urgent or frequent bowel movements. ? Incomplete bowel movements. ? Painful bowel movements. ? Leaking stool or gas.  Unexplained genital or rectal pain.  Genital or rectal muscle spasms.  Low back pain. In women, symptoms of PFD may also include:  A heavy, full, or aching feeling in  the vagina.  A bulge that protrudes into the vagina.  Pain during or after sexual intercourse. How is this diagnosed? This condition may be diagnosed based on:  Your symptoms and medical history.  A physical exam. During the exam, your health care provider may check your pelvic muscles for tightness, spasm, pain, or weakness. This may include a rectal exam and a pelvic exam for women. In some cases, you may have diagnostic tests, such as:  Electrical muscle function tests.  Urine flow testing.  X-ray tests of bowel function.  Ultrasound of the pelvic organs. How is this treated? Treatment for this condition depends on your symptoms. Treatment options include:  Physical therapy. This may include Kegel exercises to help relax or strengthen the pelvic floor muscles.  Biofeedback. This type of therapy provides feedback on how tight your pelvic floor muscles are so that you can learn to control them.  Internal or external massage therapy.  A treatment that involves electrical stimulation of the pelvic floor muscles to help control pain (transcutaneous electrical nerve stimulation, or TENS).  Sound wave therapy (ultrasound) to reduce muscle spasms.  Medicines, such as: ? Muscle relaxants. ? Bladder control medicines. Surgery to reconstruct or support pelvic floor muscles may be an option if other treatments do not help. Follow these instructions at home: Activity  Do your usual activities as told by your health care provider. Ask your health care provider if you should modify any activities.  Do pelvic floor strengthening or relaxing exercises at home as told by your physical therapist. Lifestyle  Maintain a healthy weight.  Eat foods that are high in fiber, such as   beans, whole grains, and fresh fruits and vegetables.  Limit foods that are high in fat and processed sugars, such as fried or sweet foods.  Manage stress with relaxation techniques such as yoga or  meditation. General instructions  If you have problems with leakage: ? Use absorbable pads or wear padded underwear. ? Wash frequently with mild soap. ? Keep your genital and anal area as clean and dry as possible. ? Ask your health care provider if you should try a barrier cream to prevent skin irritation.  Take warm baths to relieve pelvic muscle tension or spasms.  Take over-the-counter and prescription medicines only as told by your health care provider.  Keep all follow-up visits as told by your health care provider. This is important. Contact a health care provider if you:  Are not improving with home care.  Have signs or symptoms of PFD that get worse at home.  Develop new signs or symptoms at home.  Have signs of a urinary tract infection, such as: ? Fever. ? Chills. ? Urinary frequency. ? A burning feeling when urinating.  Have not had a bowel movement in 3 days (constipation). Summary  Pelvic floor dysfunction results when the muscles and connective tissues in your pelvic floor do not work well.  These muscles and their connections form a sling that supports your colon and bladder. In men, these muscles also support the prostate gland. In women, they also support the uterus.  PFD may be caused by an injury to the pelvic area or by a weakening of pelvic muscles.  PFD causes pelvic floor muscles to be too weak, too tight, or a combination of both. Symptoms may vary from person to person.  In most cases, PFD can be treated with physical therapies and medicines. Surgery may be an option if other treatments do not help. This information is not intended to replace advice given to you by your health care provider. Make sure you discuss any questions you have with your health care provider. Document Revised: 07/01/2018 Document Reviewed: 07/01/2018 Elsevier Patient Education  2020 [employer]  

## 2020-01-07 NOTE — Progress Notes (Signed)
01/07/20 3:50 PM   Shannon Campbell 25-Jul-1976 [id number]  Referring provider: Danelle Berry, NP [place],  [place] [address]  CC: Urinary symptoms, history of kidney stones  HPI: I saw Ms. Shannon Campbell in urology clinic in consultation from [place] a [name], NP for urinary symptoms and distant history of kidney stones.  She is a 44 year old female with a number of co-morbidities including anxiety, depression, fibromyalgia, and substance abuse.  She has been recently followed by gynecology and underwent laparoscopic bilateral salpingectomy and oophorectomy in December 2020.  Her primary complaints today are 5 months of bilateral low back pain that radiates into the lower abdomen as well as urinary symptoms of feeling of incomplete emptying with straining and stress urinary incontinence.  She also reports a few episodes of gross hematuria in the last few weeks with dark pink to red urine without clots.  She was previously trialed on Flomax by her gynecologist Dr. Marcelline Mates with no improvement in her urinary symptoms.  She denies any urgency or urge incontinence.  Her last cross-sectional imaging is March 2019 CT abdomen pelvis with contrast that showed no evidence of hydronephrosis or nephrolithiasis.  She has a 15-pack-year smoking history and continues to smoke half a pack a day.  Urinalysis today benign with 0-5 WBCs, 0 RBCs, no bacteria, nitrite negative, no leukocytes.  PVR in clinic today is normal at 29 mL.   PMH: Past Medical History:  Diagnosis Date  . Acute cholecystitis 12/11/2016  . Anxiety   . Asthma   . Calculus of gallbladder with acute cholecystitis without obstruction 12/11/2016  . Chicken pox 07/08/2015  . Depression   . Fibromyalgia   . GERD (gastroesophageal reflux disease)   . History of cardiac arrest   . Pap smear abnormality of cervix with LGSIL   . Pneumonia   . Substance abuse ([place])   . VAIN (vaginal intraepithelial neoplasia)     Surgical  History: Past Surgical History:  Procedure Laterality Date  . ABDOMINAL HYSTERECTOMY  2013  . BREAST EXCISIONAL BIOPSY Left 2005   benign  . CESAREAN SECTION  1999  . CHOLECYSTECTOMY N/A 12/12/2016   Procedure: LAPAROSCOPIC CHOLECYSTECTOMY;  Surgeon: Clayburn Pert, MD;  Location: [facility] ORS;  Service: General;  Laterality: N/A;  . COLONOSCOPY WITH PROPOFOL N/A 02/24/2019   Procedure: COLONOSCOPY WITH PROPOFOL;  Surgeon: Lin Landsman, MD;  Location: [facility] ENDOSCOPY;  Service: Gastroenterology;  Laterality: N/A;  . ESOPHAGOGASTRODUODENOSCOPY (EGD) WITH PROPOFOL N/A 02/24/2019   Procedure: ESOPHAGOGASTRODUODENOSCOPY (EGD) WITH PROPOFOL;  Surgeon: Lin Landsman, MD;  Location: [facility] ENDOSCOPY;  Service: Gastroenterology;  Laterality: N/A;  . HERNIA REPAIR  2002  . LAPAROSCOPIC BILATERAL SALPINGECTOMY Bilateral 12/01/2019   Procedure: LAPAROSCOPIC BILATERAL SALPINGECTOMY;  Surgeon: Rubie Maid, MD;  Location: [facility] ORS;  Service: Gynecology;  Laterality: Bilateral;  . NASAL SINUS SURGERY    . OOPHORECTOMY Left 12/01/2019   Procedure: OOPHORECTOMY;  Surgeon: Rubie Maid, MD;  Location: [facility] ORS;  Service: Gynecology;  Laterality: Left;  . WRIST SURGERY      Allergies:  Allergies  Allergen Reactions  . Prednisone Anaphylaxis, Hives, Swelling and Other (See Comments)    Throat swells     Family History: Family History  Problem Relation Age of Onset  . Thyroid disease Mother   . Hypertension Father   . Hemochromatosis Father   . Breast cancer Maternal Grandmother     Social History:  reports that she has been smoking cigarettes. She started smoking about 5 years ago. She  has a 15.00 pack-year smoking history. She has never used smokeless tobacco. She reports previous drug use. Drugs: "Crack" cocaine, Heroin, and Cocaine. She reports that she does not drink alcohol.  ROS: Please see flowsheet from today's date for complete review of systems.  Physical Exam: BP 101/69    Pulse (!) 109   Ht 5\' 3"  (1.6 m)   Wt 160 lb (72.6 kg)   LMP 12/26/2011 (LMP Unknown)   BMI 28.34 kg/m    Constitutional:  Alert and oriented, No acute distress. Cardiovascular: No clubbing, cyanosis, or edema. Respiratory: Normal respiratory effort, no increased work of breathing. GI: Abdomen is soft, nontender, nondistended, no abdominal masses GU: No CVA tenderness Lymph: No cervical or inguinal lymphadenopathy. Skin: No rashes, bruises or suspicious lesions. Neurologic: Grossly intact, no focal deficits, moving all 4 extremities. Psychiatric: Normal mood and affect.  Laboratory Data: Urinalysis today 0-5 WBCs, 0 RBCS, no bacteria, nitrite negative  Pertinent Imaging: See HPI  Assessment & Plan:   In summary, she is a comorbid 43 year old female who presents with 5 months of bilateral low back pain and urinary symptoms of straining to urinate, stress urinary incontinence, and urinary frequency.  Urinalysis without any signs of infection or microscopic hematuria today.    We discussed common possible etiologies of hematuria including malignancy, urolithiasis, medical renal disease, and idiopathic. Standard workup recommended by the AUA includes imaging with CT urogram to assess the upper tracts, and cystoscopy. Cytology is performed on patient's with gross hematuria to look for malignant cells in the urine.   RTC for CT urogram and cystoscopy If hematuria work-up is negative, would recommend referral to pelvic floor physical therapy for urinary symptoms and pain.  A total of 60 minutes were spent face-to-face with the patient, greater than 50% was spent in patient education, counseling, and coordination of care regarding abdominal pain, flank pain, urinary symptoms, gross hematuria, and urinary symptoms.   48, MD  [facility] [address], [address] [place], [place] [phone]

## 2020-01-08 ENCOUNTER — Telehealth: Payer: Self-pay

## 2020-01-08 LAB — URINALYSIS, COMPLETE
Bilirubin, UA: NEGATIVE
Glucose, UA: NEGATIVE
Ketones, UA: NEGATIVE
Leukocytes,UA: NEGATIVE
Nitrite, UA: NEGATIVE
Protein,UA: NEGATIVE
RBC, UA: NEGATIVE
Specific Gravity, UA: 1.02 (ref 1.005–1.030)
Urobilinogen, Ur: 0.2 mg/dL (ref 0.2–1.0)
pH, UA: 7 (ref 5.0–7.5)

## 2020-01-08 LAB — MICROSCOPIC EXAMINATION
Bacteria, UA: NONE SEEN
Epithelial Cells (non renal): >10 /hpf — AB (ref 0–10)
RBC, Urine: NONE SEEN /hpf (ref 0–2)

## 2020-01-08 NOTE — Telephone Encounter (Signed)
-----   Message from Brian C Sninsky, MD sent at 01/08/2020  8:14 AM EST ----- Regarding: follow up Oki- urine yesterday showed no infection or microscopic blood. Recommend CT and cysto to work up the blood she saw in her urine and look for kidney stones. CT urogram ordered.  Tracy- please schedule cysto in 3-4 weeks after completion of CT, thanks  Brian Sninsky, MD 01/08/2020   

## 2020-01-08 NOTE — Telephone Encounter (Signed)
Called pt informed her of the information below. Pt gave verbal understanding.  

## 2020-01-08 NOTE — Telephone Encounter (Signed)
-----   Message from Sondra Come, MD sent at 01/08/2020  8:14 AM EST ----- Regarding: follow up Edson Snowball- urine yesterday showed no infection or microscopic blood. Recommend CT and cysto to work up the blood she saw in her urine and look for kidney stones. CT urogram ordered.  French Ana- please schedule cysto in 3-4 weeks after completion of CT, thanks  Legrand Rams, MD 01/08/2020

## 2020-01-08 NOTE — Telephone Encounter (Signed)
Cysto scheduled

## 2020-01-15 ENCOUNTER — Other Ambulatory Visit: Payer: Medicare Other

## 2020-01-16 ENCOUNTER — Ambulatory Visit: Payer: Medicare Other | Attending: Internal Medicine

## 2020-01-16 DIAGNOSIS — Z20822 Contact with and (suspected) exposure to covid-19: Secondary | ICD-10-CM

## 2020-01-17 LAB — NOVEL CORONAVIRUS, NAA: SARS-CoV-2, NAA: NOT DETECTED

## 2020-01-22 NOTE — Progress Notes (Signed)
Pt present for f/u perimenopausal and vasomotor sx. Pt stated not taking Premarin due to noticing side effects.

## 2020-01-23 ENCOUNTER — Ambulatory Visit
Admission: RE | Admit: 2020-01-23 | Discharge: 2020-01-23 | Disposition: A | Discharge status: Home / Self Care | Payer: Medicare Other | Source: Ambulatory Visit | Attending: Nurse Practitioner | Admitting: Nurse Practitioner

## 2020-01-23 ENCOUNTER — Other Ambulatory Visit: Payer: Self-pay

## 2020-01-23 ENCOUNTER — Ambulatory Visit (INDEPENDENT_AMBULATORY_CARE_PROVIDER_SITE_OTHER): Payer: Medicare Other | Admitting: Obstetrics and Gynecology

## 2020-01-23 ENCOUNTER — Encounter: Payer: Self-pay | Admitting: Obstetrics and Gynecology

## 2020-01-23 VITALS — BP 126/81 | HR 99 | Ht 63.0 in | Wt 156.0 lb

## 2020-01-23 DIAGNOSIS — Z1231 Encounter for screening mammogram for malignant neoplasm of breast: Secondary | ICD-10-CM | POA: Diagnosis present

## 2020-01-23 DIAGNOSIS — N951 Menopausal and female climacteric states: Secondary | ICD-10-CM

## 2020-01-23 NOTE — Progress Notes (Signed)
    GYNECOLOGY PROGRESS NOTE  Subjective:    Patient ID: Shannon Campbell, female    DOB: Oct 25, 1976, 44 y.o.   MRN: [id number]  HPI  Patient is a 44 y.o. G30P0102 female who presents for 1 month f/u of perimenopausal symptoms (decreased libido, dyspareunia, hot flushes). Patient notes that after changing from Imvexxy for local symptoms to systemic HRT with Premarin, she began to notice side effects (moderate mood changes). She states that she stopped her medication after several weeks.  After stopping the education, she notes that her perimenopausal symptoms resolved.  Is feeling great now.   The following portions of the patient's history were reviewed and updated as appropriate: allergies, current medications, past family history, past medical history, past social history, past surgical history and problem list.  Review of Systems Pertinent items noted in HPI and remainder of comprehensive ROS otherwise negative.   Objective:   Blood pressure 126/81, pulse 99, height 5\' 3"  (1.6 m), weight 156 lb (70.8 kg), last menstrual period 12/26/2011. General appearance: alert and no distress Psychologic: Normal mood, affect, though content.   Assessment:   Perimenopausal symptoms  Plan:   - Patient note symptoms have resolved spontaneously after discontinuing her Premarin.  Discussed that as long as symptoms have improved, she can discontinue the medication.  - RTC as needed.    02/23/2012, MD [facility]

## 2020-01-24 ENCOUNTER — Encounter: Payer: Self-pay | Admitting: Obstetrics and Gynecology

## 2020-01-27 ENCOUNTER — Other Ambulatory Visit: Payer: Self-pay

## 2020-01-27 ENCOUNTER — Ambulatory Visit
Admission: RE | Admit: 2020-01-27 | Discharge: 2020-01-27 | Disposition: A | Discharge status: Home / Self Care | Payer: Medicare Other | Source: Ambulatory Visit | Attending: Urology | Admitting: Urology

## 2020-01-27 DIAGNOSIS — R310 Gross hematuria: Secondary | ICD-10-CM | POA: Diagnosis present

## 2020-01-27 MED ORDER — IOHEXOL 300 MG/ML  SOLN
150.0000 mL | Freq: Once | INTRAMUSCULAR | Status: AC | PRN
  Administered 2020-01-27: 150 mL via INTRAVENOUS

## 2020-01-30 ENCOUNTER — Encounter (INDEPENDENT_AMBULATORY_CARE_PROVIDER_SITE_OTHER): Payer: Self-pay

## 2020-02-04 ENCOUNTER — Other Ambulatory Visit: Payer: Medicare Other | Admitting: Urology

## 2020-02-13 MED ORDER — ESTRADIOL 0.05 MG/24HR TD PTWK: 0.0500 mg | MEDICATED_PATCH | TRANSDERMAL | 12 refills | Status: AC

## 2020-02-17 ENCOUNTER — Other Ambulatory Visit: Payer: Self-pay

## 2020-02-17 ENCOUNTER — Ambulatory Visit
Admission: EM | Admit: 2020-02-17 | Discharge: 2020-02-17 | Disposition: A | Discharge status: Home / Self Care | Payer: Medicare Other

## 2020-02-17 DIAGNOSIS — J029 Acute pharyngitis, unspecified: Secondary | ICD-10-CM | POA: Diagnosis not present

## 2020-02-17 DIAGNOSIS — R05 Cough: Secondary | ICD-10-CM | POA: Diagnosis not present

## 2020-02-17 DIAGNOSIS — R059 Cough, unspecified: Secondary | ICD-10-CM

## 2020-02-17 DIAGNOSIS — F17210 Nicotine dependence, cigarettes, uncomplicated: Secondary | ICD-10-CM | POA: Diagnosis not present

## 2020-02-17 MED ORDER — AMOXICILLIN 500 MG PO CAPS: 500.0000 mg | ORAL_CAPSULE | Freq: Two times a day (BID) | ORAL | 0 refills | Status: AC

## 2020-02-17 MED ORDER — BENZONATATE 100 MG PO CAPS: 100.0000 mg | ORAL_CAPSULE | Freq: Three times a day (TID) | ORAL | 0 refills | Status: AC | PRN

## 2020-02-17 NOTE — Discharge Instructions (Addendum)
It was very nice seeing you today in clinic. Thank you for entrusting me with your care.   Rest and increase fluid intake. Use medications as prescribed. May use Tylenol and/or Ibuprofen as needed for pain/fever.  Make arrangements to follow up with your regular doctor in 1 week for re-evaluation if not improving. If your symptoms/condition worsens, please seek follow up care either here or in the ER. Please remember, our [place] providers are "right here with you" when you need us.   Again, it was my pleasure to take care of you today. Thank you for choosing our clinic. I hope that you start to feel better quickly.   Amoni Morales, MSN, APRN, FNP-C, CEN Advanced Practice Provider [facility] 

## 2020-02-17 NOTE — ED Triage Notes (Signed)
Pt presents with c/o sore throat, fatigue for about a week. She also reports a feeling of a "knot" in her throat (present for several months). She also has a chronic cough and does see pulmonology for this. She denies any known sick contacts. She denies wheeze, shob, fever/chills or n/v/d. She reports her cough does increase at night and has some issues with post-tussive emesis. She does have inhalers for her cough. She has been COVID tested several times, most recently 3 weeks ago. She states all testing has been negative.

## 2020-02-19 NOTE — ED Provider Notes (Signed)
[place], [place]   Name: Shannon Campbell DOB: 11-29-1976 MRN: [id number] CSN: [id number] PCP: Fayrene Helper, NP  Arrival date and time:  02/17/20 1626  Chief Complaint:  Sore Throat   NOTE: Prior to seeing the patient today, I have reviewed the triage nursing documentation and vital signs. Clinical staff has updated patient's PMH/PSHx, current medication list, and drug allergies/intolerances to ensure comprehensive history available to assist in medical decision making.   History:   HPI: Shannon Campbell is a 44 y.o. female who presents today with complaints of fatigue, sore throat, and paranasal sinus pressure for the last 1 to 2 weeks.  Patient denies any associated fevers. Additionally, she has had a cough for the last 2 months.  She notes that her cough is worse at night, and that she is currently seeing pulmonology for this complaint. Patient denies any shortness of breath or wheezing.  She uses several inhalers and allergy medications as prescribed by her pulmonologist. She denies that she has experienced any nausea, vomiting, diarrhea, or abdominal pain. She is eating and drinking well. Patient denies any perceived alterations to her sense of taste or smell. Patient denies being in close contact with anyone known to be ill. She has not been tested for SARS-CoV-2 (novel coronavirus) in the past 14 days; last tested negative about 3 weeks ago per her report. In efforts to conservatively manage her symptoms at home, the patient notes that she has used saline nasal spray, guaifenesin, and Tussionex, which have helped to improve her symptoms to some degree.   Past Medical History:  Diagnosis Date  . Acute cholecystitis 12/11/2016  . Anxiety   . Asthma   . Calculus of gallbladder with acute cholecystitis without obstruction 12/11/2016  . Chicken pox 07/08/2015  . Depression   . Fibromyalgia   . GERD (gastroesophageal reflux disease)   . History of cardiac arrest   . Pap smear  abnormality of cervix with LGSIL   . Pneumonia   . Substance abuse ([facility])   . VAIN (vaginal intraepithelial neoplasia)     Past Surgical History:  Procedure Laterality Date  . ABDOMINAL HYSTERECTOMY  2013  . BREAST EXCISIONAL BIOPSY Left 2005   benign  . CESAREAN SECTION  1999  . CHOLECYSTECTOMY N/A 12/12/2016   Procedure: LAPAROSCOPIC CHOLECYSTECTOMY;  Surgeon: Ricarda Frame, MD;  Location: [facility] ORS;  Service: General;  Laterality: N/A;  . COLONOSCOPY WITH PROPOFOL N/A 02/24/2019   Procedure: COLONOSCOPY WITH PROPOFOL;  Surgeon: Toney Reil, MD;  Location: [facility] ENDOSCOPY;  Service: Gastroenterology;  Laterality: N/A;  . ESOPHAGOGASTRODUODENOSCOPY (EGD) WITH PROPOFOL N/A 02/24/2019   Procedure: ESOPHAGOGASTRODUODENOSCOPY (EGD) WITH PROPOFOL;  Surgeon: Toney Reil, MD;  Location[facility] ENDOSCOPY;  Service: Gastroenterology;  Laterality: N/A;  . HERNIA REPAIR  2002  . LAPAROSCOPIC BILATERAL SALPINGECTOMY Bilateral 12/01/2019   Procedure: LAPAROSCOPIC BILATERAL SALPINGECTOMY;  Surgeon: Hildred Laser, MD;  Location: [facility] ORS;  Service: Gynecology;  Laterality: Bilateral;  . NASAL SINUS SURGERY    . OOPHORECTOMY Left 12/01/2019   Procedure: OOPHORECTOMY;  Surgeon: Hildred Laser, MD;  Location: [facility] ORS;  Service: Gynecology;  Laterality: Left;  . WRIST SURGERY      Family History  Problem Relation Age of Onset  . Thyroid disease Mother   . Hypertension Father   . Hemochromatosis Father   . Breast cancer Maternal Grandmother     Social History   Tobacco Use  . Smoking status: Current Every Day Smoker    Packs/day: 1.00    Years:  15.00    Pack years: 15.00    Types: Cigarettes    Start date: 10/11/2014  . Smokeless tobacco: Never Used  . Tobacco comment: nicotine  Substance Use Topics  . Alcohol use: No    Comment: in recovery for 3.5 years; last used ETOH 1 month ago  . Drug use: Not Currently    Types: "Crack" cocaine, Heroin, Cocaine    Comment: in recovery  for 3.5 years; Last used drugs 1 month ago    Patient Active Problem List   Diagnosis Date Noted  . Numbness 09/09/2019  . Gastroesophageal reflux disease   . Black stools   . Chronic constipation   . ADHD 02/18/2019  . Bipolar 2 disorder (HCC) 03/20/2018  . 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 Hyperlipidemia, mixed 01/29/2018  . Fibromyalgia 01/03/2018  . Generalized anxiety disorder 01/03/2018  . History of cardiac arrest 01/03/2018  . Substance abuse (HCC) 01/03/2018  . 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 Numbness of hand 06/29/2017  . Hereditary hemochromatosis (HCC) 06/03/2017  . Uncomplicated alcohol dependence (HCC) 10/14/2016  . Cervical dysplasia 08/09/2016  . Chest pain 08/07/2016  . Chronic fatigue 11/01/2015  . Chronic pain of multiple joints 11/01/2015  . Elevated antinuclear antibody (ANA) level 11/01/2015  . PMS (premenstrual syndrome) 07/09/2015  . Tobacco abuse 07/09/2015  . Asthma without status asthmaticus 07/08/2015  . Clinical depression 07/08/2015  . 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 Inflamed nasal mucosa 07/08/2015  . Avitaminosis D 07/08/2015    Home Medications:    Current Meds  Medication Sig  . acetaminophen (ACETAMINOPHEN 8 HOUR) 650 MG CR tablet Take 1 tablet (650 mg total) by mouth every 8 (eight) hours as needed for pain.  Marland Kitchen albuterol (PROVENTIL HFA;VENTOLIN HFA) 108 (90 Base) MCG/ACT inhaler Inhale 1-2 puffs into the lungs every 6 (six) hours as needed for wheezing or shortness of breath.  Marland Kitchen buPROPion (WELLBUTRIN XL) 300 MG 24 hr tablet Take 300 mg by mouth every morning.   Marland Kitchen CHANTIX STARTING MONTH PAK 0.5 MG X 11 & 1 MG X 42 tablet Take 0.5-1 mg by mouth as directed.  . clonazePAM (KLONOPIN) 0.5 MG tablet Take 0.5 mg by mouth daily as needed for anxiety.   . diclofenac Sodium (VOLTAREN) 1 % GEL Apply 1 application topically 4 (four) times daily as needed for pain.  Marland Kitchen docusate sodium (COLACE) 100 MG  capsule Take 1 capsule (100 mg total) by mouth 2 (two) times daily.  Marland Kitchen EPINEPHrine 0.3 mg/0.3 mL IJ SOAJ injection Inject 0.3 mg into the muscle as needed for anaphylaxis.   Marland Kitchen estradiol (CLIMARA - DOSED IN MG/24 HR) 0.05 mg/24hr patch Place 1 patch (0.05 mg total) onto the skin once a week.  . fluticasone (FLONASE) 50 MCG/ACT nasal spray Place 1 spray into both nostrils daily.  Marland Kitchen lamoTRIgine (LAMICTAL) 100 MG tablet Take 100 mg by mouth daily.  . montelukast (SINGULAIR) 10 MG tablet Take 10 mg by mouth daily.  Marland Kitchen nystatin (MYCOSTATIN) 100000 UNIT/ML suspension Take 5 mLs by mouth 4 (four) times daily as needed.  . pantoprazole (PROTONIX) 40 MG tablet Take 40 mg by mouth daily.  Marland Kitchen SPIRIVA RESPIMAT 2.5 MCG/ACT AERS Inhale 1 capsule into the lungs in the morning and at bedtime.  . traZODone (DESYREL) 50 MG tablet Take 100 mg by mouth at bedtime as needed for sleep.   . Vitamin D, Ergocalciferol, (DRISDOL) 1.25 MG (50000 UT) CAPS capsule Take 50,000 Units by mouth  once a week.  Marland Kitchen VYVANSE 70 MG capsule Take 70 mg by mouth daily.    Allergies:   Prednisone  Review of Systems (ROS):  Review of systems NEGATIVE unless otherwise noted in narrative H&P section.   Vital Signs: Today's Vitals   02/17/20 1705 02/17/20 1706 02/17/20 1709 02/17/20 1732  BP:   109/85   Pulse:   (!) 109   Resp:   19   Temp:   98.2 F (36.8 C)   TempSrc:   Oral   SpO2:   100%   Weight:  160 lb (72.6 kg)    Height:  5\' 3"  (1.6 m)    PainSc: 10-Worst pain ever   10-Worst pain ever    Physical Exam: Physical Exam  Constitutional: She is oriented to person, place, and time and well-developed, well-nourished, and in no distress.  HENT:  Head: Normocephalic and atraumatic.  Nose: Mucosal edema and sinus tenderness present. No rhinorrhea.  Mouth/Throat: Uvula is midline and mucous membranes are normal. Oropharyngeal exudate, posterior oropharyngeal edema (mild) and posterior oropharyngeal erythema present.  Tonsils  grade II with (+) white exudative patches. No dysphagia; able to handle oral secretions and thin liquids. No SOB, stridor, or wheezing. Phonation normal.   Eyes: Pupils are equal, round, and reactive to light.  Cardiovascular: Regular rhythm, normal heart sounds and intact distal pulses. Tachycardia present.  Pulmonary/Chest: Effort normal and breath sounds normal.  Mild cough noted in clinic. No SOB or increased WOB. No distress. Able to speak in complete sentences without difficulties. SPO2 100% on RA.  Musculoskeletal:     Cervical back: Normal range of motion and neck supple.  Lymphadenopathy:    She has no cervical adenopathy.  Neurological: She is alert and oriented to person, place, and time. Gait normal.  Skin: Skin is warm and dry. No rash noted. She is not diaphoretic.  Psychiatric: Mood, memory, affect and judgment normal.  Nursing note and vitals reviewed.   Urgent Care Treatments / Results:   No orders of the defined types were placed in this encounter.   LABS: PLEASE NOTE: all labs that were ordered this encounter are listed, however only abnormal results are displayed. Labs Reviewed - No data to display  EKG: -None  RADIOLOGY: No results found.  PROCEDURES: Procedures  MEDICATIONS RECEIVED THIS VISIT: Medications - No data to display  PERTINENT CLINICAL COURSE NOTES/UPDATES:   Initial Impression / Assessment and Plan / Urgent Care Course:  Pertinent labs & imaging results that were available during my care of the patient were personally reviewed by me and considered in my medical decision making (see lab/imaging section of note for values and interpretations).  Shannon Campbell is a 44 y.o. female who presents to [facility] today with complaints of Sore Throat  Patient is well appearing overall in clinic today. She does not appear to be in any acute distress. Presenting symptoms (see HPI) and exam as documented above.  Exam consistent with pharyngitis  with associated cough.  Patient is already seeing pulmonology for her cough and is taking several inhalers and allergy medications.  Given the chronicity of her symptoms and failed outpatient treatments at home, will proceed with a 10-day course of amoxicillin. Discussed supportive care measures at home during acute phase of illness. Patient to rest as much as possible. She was encouraged to ensure adequate hydration (water and ORS) to prevent dehydration and electrolyte derangements. Patient may use APAP and/or IBU on an as needed basis for pain/fever.  Will send in a supply of benzonatate (Tessalon) for patient to use on a PRN basis to help with her cough.   Discussed follow up with primary care physician in 1 week for re-evaluation. I have reviewed the follow up and strict return precautions for any new or worsening symptoms. Patient is aware of symptoms that would be deemed urgent/emergent, and would thus require further evaluation either here or in the emergency department. At the time of discharge, she verbalized understanding and consent with the discharge plan as it was reviewed with her. All questions were fielded by provider and/or clinic staff prior to patient discharge.    Final Clinical Impressions / Urgent Care Diagnoses:   Final diagnoses:  Pharyngitis, unspecified etiology  Cough    New Prescriptions:  [place] Controlled Substance Registry consulted? Not Applicable  Meds ordered this encounter  Medications  . amoxicillin (AMOXIL) 500 MG capsule    Sig: Take 1 capsule (500 mg total) by mouth 2 (two) times daily for 10 days.    Dispense:  20 capsule    Refill:  0  . benzonatate (TESSALON) 100 MG capsule    Sig: Take 1 capsule (100 mg total) by mouth 3 (three) times daily as needed for cough.    Dispense:  21 capsule    Refill:  0    Recommended Follow up Care:  Patient encouraged to follow up with the following provider within the specified time frame, or sooner as dictated by the  severity of her symptoms. As always, she was instructed that for any urgent/emergent care needs, she should seek care either here or in the emergency department for more immediate evaluation.  Follow-up Information    Bos[place], Ardis Rowan, NP In 1 week.   Specialty: Nurse Practitioner Why: General reassessment of symptoms if not improving Contact information: [place] [address] [phone]         NOTE: This note was prepared using Dragon dictation software along with smaller phrase technology. Despite my best ability to proofread, there is the potential that transcriptional errors may still occur from this process, and are completely unintentional.    Karen Kitchens, NP 02/19/20 [phone]

## 2020-02-23 ENCOUNTER — Other Ambulatory Visit: Payer: Self-pay | Admitting: Specialist

## 2020-02-23 ENCOUNTER — Other Ambulatory Visit: Payer: Self-pay | Admitting: Internal Medicine

## 2020-02-23 DIAGNOSIS — R1310 Dysphagia, unspecified: Secondary | ICD-10-CM

## 2020-02-23 HISTORY — PX: REDUCTION MAMMAPLASTY: SUR839

## 2020-02-25 ENCOUNTER — Encounter: Payer: Medicare Other | Admitting: Obstetrics and Gynecology

## 2020-04-15 ENCOUNTER — Ambulatory Visit: Payer: Medicare Other

## 2020-07-20 ENCOUNTER — Ambulatory Visit: Payer: Medicare Other

## 2020-11-02 ENCOUNTER — Encounter: Payer: Medicare Other | Admitting: Obstetrics and Gynecology

## 2021-06-09 ENCOUNTER — Other Ambulatory Visit (HOSPITAL_BASED_OUTPATIENT_CLINIC_OR_DEPARTMENT_OTHER): Payer: Self-pay | Admitting: Nurse Practitioner

## 2021-06-09 DIAGNOSIS — Z1231 Encounter for screening mammogram for malignant neoplasm of breast: Secondary | ICD-10-CM

## 2021-06-30 ENCOUNTER — Other Ambulatory Visit: Payer: Self-pay

## 2021-06-30 ENCOUNTER — Ambulatory Visit (INDEPENDENT_AMBULATORY_CARE_PROVIDER_SITE_OTHER): Payer: Medicare Other

## 2021-06-30 DIAGNOSIS — Z1231 Encounter for screening mammogram for malignant neoplasm of breast: Secondary | ICD-10-CM | POA: Diagnosis not present

## 2021-07-13 DIAGNOSIS — F17209 Nicotine dependence, unspecified, with unspecified nicotine-induced disorders: Secondary | ICD-10-CM | POA: Diagnosis not present

## 2021-07-13 DIAGNOSIS — R109 Unspecified abdominal pain: Secondary | ICD-10-CM | POA: Diagnosis not present

## 2021-07-13 DIAGNOSIS — E538 Deficiency of other specified B group vitamins: Secondary | ICD-10-CM | POA: Diagnosis not present

## 2021-07-27 DIAGNOSIS — K582 Mixed irritable bowel syndrome: Secondary | ICD-10-CM | POA: Diagnosis not present

## 2021-07-27 DIAGNOSIS — R1013 Epigastric pain: Secondary | ICD-10-CM | POA: Diagnosis not present

## 2021-07-27 DIAGNOSIS — K219 Gastro-esophageal reflux disease without esophagitis: Secondary | ICD-10-CM | POA: Diagnosis not present

## 2021-07-27 DIAGNOSIS — Z9049 Acquired absence of other specified parts of digestive tract: Secondary | ICD-10-CM | POA: Diagnosis not present

## 2021-07-29 DIAGNOSIS — M542 Cervicalgia: Secondary | ICD-10-CM | POA: Diagnosis not present

## 2021-07-29 DIAGNOSIS — M546 Pain in thoracic spine: Secondary | ICD-10-CM | POA: Diagnosis not present

## 2021-07-29 DIAGNOSIS — M9905 Segmental and somatic dysfunction of pelvic region: Secondary | ICD-10-CM | POA: Diagnosis not present

## 2021-07-29 DIAGNOSIS — M545 Low back pain, unspecified: Secondary | ICD-10-CM | POA: Diagnosis not present

## 2021-07-29 DIAGNOSIS — M9901 Segmental and somatic dysfunction of cervical region: Secondary | ICD-10-CM | POA: Diagnosis not present

## 2021-07-29 DIAGNOSIS — M9902 Segmental and somatic dysfunction of thoracic region: Secondary | ICD-10-CM | POA: Diagnosis not present

## 2021-08-08 DIAGNOSIS — M545 Low back pain, unspecified: Secondary | ICD-10-CM | POA: Diagnosis not present

## 2021-08-08 DIAGNOSIS — M9901 Segmental and somatic dysfunction of cervical region: Secondary | ICD-10-CM | POA: Diagnosis not present

## 2021-08-08 DIAGNOSIS — M9905 Segmental and somatic dysfunction of pelvic region: Secondary | ICD-10-CM | POA: Diagnosis not present

## 2021-08-08 DIAGNOSIS — M9902 Segmental and somatic dysfunction of thoracic region: Secondary | ICD-10-CM | POA: Diagnosis not present

## 2021-08-08 DIAGNOSIS — M542 Cervicalgia: Secondary | ICD-10-CM | POA: Diagnosis not present

## 2021-08-08 DIAGNOSIS — M546 Pain in thoracic spine: Secondary | ICD-10-CM | POA: Diagnosis not present

## 2021-08-10 DIAGNOSIS — M9905 Segmental and somatic dysfunction of pelvic region: Secondary | ICD-10-CM | POA: Diagnosis not present

## 2021-08-10 DIAGNOSIS — M542 Cervicalgia: Secondary | ICD-10-CM | POA: Diagnosis not present

## 2021-08-10 DIAGNOSIS — M9901 Segmental and somatic dysfunction of cervical region: Secondary | ICD-10-CM | POA: Diagnosis not present

## 2021-08-10 DIAGNOSIS — M545 Low back pain, unspecified: Secondary | ICD-10-CM | POA: Diagnosis not present

## 2021-08-10 DIAGNOSIS — M9902 Segmental and somatic dysfunction of thoracic region: Secondary | ICD-10-CM | POA: Diagnosis not present

## 2021-08-10 DIAGNOSIS — M546 Pain in thoracic spine: Secondary | ICD-10-CM | POA: Diagnosis not present

## 2021-08-31 DIAGNOSIS — B9681 Helicobacter pylori [H. pylori] as the cause of diseases classified elsewhere: Secondary | ICD-10-CM | POA: Diagnosis not present

## 2021-08-31 DIAGNOSIS — K219 Gastro-esophageal reflux disease without esophagitis: Secondary | ICD-10-CM | POA: Diagnosis not present

## 2021-08-31 DIAGNOSIS — K2960 Other gastritis without bleeding: Secondary | ICD-10-CM | POA: Diagnosis not present

## 2021-08-31 DIAGNOSIS — K3189 Other diseases of stomach and duodenum: Secondary | ICD-10-CM | POA: Diagnosis not present

## 2021-09-05 DIAGNOSIS — M9902 Segmental and somatic dysfunction of thoracic region: Secondary | ICD-10-CM | POA: Diagnosis not present

## 2021-09-05 DIAGNOSIS — M542 Cervicalgia: Secondary | ICD-10-CM | POA: Diagnosis not present

## 2021-09-05 DIAGNOSIS — M9905 Segmental and somatic dysfunction of pelvic region: Secondary | ICD-10-CM | POA: Diagnosis not present

## 2021-09-05 DIAGNOSIS — M9901 Segmental and somatic dysfunction of cervical region: Secondary | ICD-10-CM | POA: Diagnosis not present

## 2021-09-05 DIAGNOSIS — M546 Pain in thoracic spine: Secondary | ICD-10-CM | POA: Diagnosis not present

## 2021-09-05 DIAGNOSIS — M545 Low back pain, unspecified: Secondary | ICD-10-CM | POA: Diagnosis not present

## 2021-09-19 DIAGNOSIS — R5383 Other fatigue: Secondary | ICD-10-CM | POA: Diagnosis not present

## 2021-09-19 DIAGNOSIS — E785 Hyperlipidemia, unspecified: Secondary | ICD-10-CM | POA: Diagnosis not present

## 2021-09-19 DIAGNOSIS — E538 Deficiency of other specified B group vitamins: Secondary | ICD-10-CM | POA: Diagnosis not present

## 2021-09-19 DIAGNOSIS — L700 Acne vulgaris: Secondary | ICD-10-CM | POA: Diagnosis not present

## 2021-11-28 DIAGNOSIS — Z79899 Other long term (current) drug therapy: Secondary | ICD-10-CM | POA: Diagnosis not present

## 2021-11-30 DIAGNOSIS — M9901 Segmental and somatic dysfunction of cervical region: Secondary | ICD-10-CM | POA: Diagnosis not present

## 2021-11-30 DIAGNOSIS — M542 Cervicalgia: Secondary | ICD-10-CM | POA: Diagnosis not present

## 2021-11-30 DIAGNOSIS — M9905 Segmental and somatic dysfunction of pelvic region: Secondary | ICD-10-CM | POA: Diagnosis not present

## 2021-11-30 DIAGNOSIS — M546 Pain in thoracic spine: Secondary | ICD-10-CM | POA: Diagnosis not present

## 2021-11-30 DIAGNOSIS — M545 Low back pain, unspecified: Secondary | ICD-10-CM | POA: Diagnosis not present

## 2021-11-30 DIAGNOSIS — M9902 Segmental and somatic dysfunction of thoracic region: Secondary | ICD-10-CM | POA: Diagnosis not present

## 2024-09-15 ENCOUNTER — Other Ambulatory Visit: Payer: Self-pay | Admitting: Obstetrics and Gynecology

## 2024-09-15 DIAGNOSIS — Z362 Encounter for other antenatal screening follow-up: Secondary | ICD-10-CM

## 2024-10-08 ENCOUNTER — Ambulatory Visit
